# Patient Record
Sex: Female | Born: 1955 | Race: White | Hispanic: No | Marital: Single | Smoking: Never smoker
Health system: Southern US, Community
[De-identification: ages and names within clinical notes are randomized; demographics above are authoritative.]

## PROBLEM LIST (undated history)

## (undated) DIAGNOSIS — F419 Anxiety disorder, unspecified: Secondary | ICD-10-CM

## (undated) DIAGNOSIS — G709 Myoneural disorder, unspecified: Secondary | ICD-10-CM

## (undated) DIAGNOSIS — E119 Type 2 diabetes mellitus without complications: Secondary | ICD-10-CM

## (undated) DIAGNOSIS — E785 Hyperlipidemia, unspecified: Secondary | ICD-10-CM

## (undated) DIAGNOSIS — I10 Essential (primary) hypertension: Secondary | ICD-10-CM

## (undated) DIAGNOSIS — T7840XA Allergy, unspecified, initial encounter: Secondary | ICD-10-CM

## (undated) DIAGNOSIS — M1990 Unspecified osteoarthritis, unspecified site: Secondary | ICD-10-CM

## (undated) DIAGNOSIS — F32A Depression, unspecified: Secondary | ICD-10-CM

## (undated) HISTORY — DX: Anxiety disorder, unspecified: F41.9

## (undated) HISTORY — DX: Type 2 diabetes mellitus without complications: E11.9

## (undated) HISTORY — PX: SPINE SURGERY: SHX786

## (undated) HISTORY — DX: Unspecified osteoarthritis, unspecified site: M19.90

## (undated) HISTORY — DX: Depression, unspecified: F32.A

## (undated) HISTORY — PX: SHOULDER SURGERY: SHX246

## (undated) HISTORY — PX: CARPAL TUNNEL WITH CUBITAL TUNNEL: SHX5608

## (undated) HISTORY — DX: Hyperlipidemia, unspecified: E78.5

## (undated) HISTORY — DX: Essential (primary) hypertension: I10

## (undated) HISTORY — DX: Myoneural disorder, unspecified: G70.9

## (undated) HISTORY — PX: HAND SURGERY: SHX662

## (undated) HISTORY — DX: Allergy, unspecified, initial encounter: T78.40XA

## (undated) HISTORY — PX: CHOLECYSTECTOMY: SHX55

## (undated) HISTORY — PX: TUBAL LIGATION: SHX77

---

## 2000-10-02 ENCOUNTER — Other Ambulatory Visit: Admission: RE | Admit: 2000-10-02 | Discharge: 2000-10-02 | Payer: Self-pay | Admitting: Family Medicine

## 2001-05-26 ENCOUNTER — Encounter: Payer: Self-pay | Admitting: Family Medicine

## 2001-05-26 ENCOUNTER — Encounter: Admission: RE | Admit: 2001-05-26 | Discharge: 2001-05-26 | Payer: Self-pay | Admitting: Family Medicine

## 2001-12-01 ENCOUNTER — Encounter: Admission: RE | Admit: 2001-12-01 | Discharge: 2001-12-01 | Payer: Self-pay | Admitting: Neurosurgery

## 2001-12-01 ENCOUNTER — Encounter: Payer: Self-pay | Admitting: Neurosurgery

## 2001-12-15 ENCOUNTER — Encounter: Admission: RE | Admit: 2001-12-15 | Discharge: 2001-12-15 | Payer: Self-pay | Admitting: Neurosurgery

## 2001-12-15 ENCOUNTER — Encounter: Payer: Self-pay | Admitting: Neurosurgery

## 2002-04-27 ENCOUNTER — Encounter: Payer: Self-pay | Admitting: Neurosurgery

## 2002-04-27 ENCOUNTER — Ambulatory Visit (HOSPITAL_COMMUNITY): Admission: RE | Admit: 2002-04-27 | Discharge: 2002-04-27 | Payer: Self-pay | Admitting: Neurosurgery

## 2003-01-16 ENCOUNTER — Encounter: Payer: Self-pay | Admitting: Rheumatology

## 2003-01-16 ENCOUNTER — Encounter: Admission: RE | Admit: 2003-01-16 | Discharge: 2003-01-16 | Payer: Self-pay | Admitting: Rheumatology

## 2003-01-25 ENCOUNTER — Encounter: Admission: RE | Admit: 2003-01-25 | Discharge: 2003-04-25 | Payer: Self-pay | Admitting: Family Medicine

## 2003-05-06 ENCOUNTER — Encounter: Admission: RE | Admit: 2003-05-06 | Discharge: 2003-08-04 | Payer: Self-pay | Admitting: Family Medicine

## 2003-09-28 ENCOUNTER — Encounter: Admission: RE | Admit: 2003-09-28 | Discharge: 2003-12-27 | Payer: Self-pay | Admitting: Family Medicine

## 2004-02-01 ENCOUNTER — Encounter: Admission: RE | Admit: 2004-02-01 | Discharge: 2004-02-01 | Payer: Self-pay | Admitting: Family Medicine

## 2004-05-29 ENCOUNTER — Ambulatory Visit: Payer: Self-pay | Admitting: Family Medicine

## 2004-06-20 ENCOUNTER — Other Ambulatory Visit: Admission: RE | Admit: 2004-06-20 | Discharge: 2004-06-20 | Payer: Self-pay | Admitting: Family Medicine

## 2005-05-30 ENCOUNTER — Ambulatory Visit: Payer: Self-pay | Admitting: Family Medicine

## 2005-07-03 ENCOUNTER — Other Ambulatory Visit: Admission: RE | Admit: 2005-07-03 | Discharge: 2005-07-03 | Payer: Self-pay | Admitting: Family Medicine

## 2005-08-21 ENCOUNTER — Ambulatory Visit (HOSPITAL_COMMUNITY): Admission: RE | Admit: 2005-08-21 | Discharge: 2005-08-21 | Payer: Self-pay | Admitting: Gastroenterology

## 2005-08-21 ENCOUNTER — Encounter (INDEPENDENT_AMBULATORY_CARE_PROVIDER_SITE_OTHER): Payer: Self-pay | Admitting: *Deleted

## 2006-09-25 ENCOUNTER — Ambulatory Visit: Payer: Self-pay | Admitting: Family Medicine

## 2006-09-30 ENCOUNTER — Other Ambulatory Visit: Admission: RE | Admit: 2006-09-30 | Discharge: 2006-09-30 | Payer: Self-pay | Admitting: Family Medicine

## 2007-06-24 ENCOUNTER — Encounter: Admission: RE | Admit: 2007-06-24 | Discharge: 2007-06-24 | Payer: Self-pay | Admitting: Family Medicine

## 2008-01-12 ENCOUNTER — Ambulatory Visit: Payer: Self-pay | Admitting: Family Medicine

## 2008-09-28 ENCOUNTER — Other Ambulatory Visit: Admission: RE | Admit: 2008-09-28 | Discharge: 2008-09-28 | Payer: Self-pay | Admitting: Family Medicine

## 2008-10-13 ENCOUNTER — Encounter: Admission: RE | Admit: 2008-10-13 | Discharge: 2008-10-13 | Payer: Self-pay | Admitting: General Surgery

## 2009-05-09 ENCOUNTER — Ambulatory Visit (HOSPITAL_COMMUNITY): Admission: RE | Admit: 2009-05-09 | Discharge: 2009-05-09 | Payer: Self-pay | Admitting: General Surgery

## 2009-06-19 ENCOUNTER — Ambulatory Visit: Payer: Self-pay | Admitting: Family Medicine

## 2010-07-08 LAB — BASIC METABOLIC PANEL
CO2: 25 mEq/L (ref 19–32)
Chloride: 105 mEq/L (ref 96–112)
Creatinine, Ser: 0.65 mg/dL (ref 0.4–1.2)
GFR calc Af Amer: >60 mL/min (ref >60)
Potassium: 4 mEq/L (ref 3.5–5.1)

## 2010-07-08 LAB — CBC
HCT: 40.9 % (ref 36.0–46.0)
MCHC: 34.9 g/dL (ref 30.0–36.0)
MCV: 91.8 fL (ref 78.0–100.0)
RBC: 4.45 MIL/uL (ref 3.87–5.11)

## 2010-07-08 LAB — GLUCOSE, CAPILLARY

## 2010-07-08 LAB — URINALYSIS, ROUTINE W REFLEX MICROSCOPIC
Glucose, UA: NEGATIVE mg/dL
Ketones, ur: NEGATIVE mg/dL
Protein, ur: NEGATIVE mg/dL
Urobilinogen, UA: 0.2 mg/dL (ref 0.0–1.0)

## 2010-07-08 LAB — DIFFERENTIAL
Basophils Relative: 1 % (ref 0–1)
Eosinophils Absolute: 1.3 10*3/uL — ABNORMAL HIGH (ref 0.0–0.7)
Eosinophils Relative: 17 % — ABNORMAL HIGH (ref 0–5)
Monocytes Absolute: 0.4 10*3/uL (ref 0.1–1.0)
Monocytes Relative: 5 % (ref 3–12)
Neutrophils Relative %: 43 % (ref 43–77)

## 2010-08-16 ENCOUNTER — Ambulatory Visit: Payer: Self-pay | Admitting: Family Medicine

## 2010-09-07 NOTE — Op Note (Signed)
NAMESUMAYA, Kathleen Hickman             ACCOUNT NO.:  [id number]   MEDICAL RECORD NO.:  [id number]          PATIENT TYPE:  AMB   LOCATION:  ENDO                         FACILITY:  [facility]   PHYSICIAN:  Bernette Redbird, M.D.   DATE OF BIRTH:  1955-06-14   DATE OF PROCEDURE:  08/21/2005  DATE OF DISCHARGE:                                 OPERATIVE REPORT   PROCEDURE:  Colonoscopy with biopsies.   ENDOSCOPIST:  Bernette Redbird, M.D.   INDICATIONS:  A 55 year old female for initial colon cancer screening  examination.  In addition, the patient has a fairly longstanding history of  diarrhea, characterized by 4-5 loose bowl movements a day made at by a 45  loose bowel movements a day, which is currently thought to possibly be due  to her metformin.   FINDINGS:  Two diminutive polyps, otherwise normal colonoscopy.   DESCRIPTION OF PROCEDURE:  The nature, purpose and risks of the procedure  had been discussed with the patient as an outpatient through our open access  program, and I reviewed the purpose and risks with her at the bedside prior  to the initiation of the exam.  She had provided written consent.  Sedation  was Phenergan 25 mg, fentanyl 75 mcg and Versed 8 mg IV without arrhythmias  or desaturation.  The Olympus adult adjustable video colonoscope was  advanced without significant difficulty to the terminal ileum, which had  normal appearance and pullback was then performed.  The quality of prep was  very good, and it is felt that all areas were well seen.   In the cecum was a diminutive 2 mm sessile polyp removed by a single cold  biopsy and there was another diminutive 2 mm sessile polyp at 12 cm from the  anal verge in the mid rectum removed by another single cold biopsy.   In addition, random mucosal biopsies were obtained along the length of the  colon to help rule out microscopic colitis.  There was no endoscopic  evidence of inflammatory bowel disease or active colitis, nor any  large  polyps, cancer, vascular ectasia or diverticular change.  Retroflexion was  not performed in the rectum due to a small rectal ampulla and the proximity  of the rectal biopsies.   However, reinspection with antegrade viewing was unremarkable in the rectum.   The patient tolerated the procedure well, and there no apparent  complications.   IMPRESSION:  1.  Diminutive colon polyps, removed as described above.  2.  Diarrhea without source endoscopically evident on current examination.   PLAN:  Await pathology results.           ______________________________  Bernette Redbird, M.D.     RB/MEDQ  D:  08/21/2005  T:  08/22/2005  Job:  [id number]   cc:   Gretta Arab. Valentina Lucks, M.D.  Fax: [phone]

## 2011-08-20 ENCOUNTER — Ambulatory Visit: Payer: Self-pay | Admitting: Family Medicine

## 2012-02-27 ENCOUNTER — Other Ambulatory Visit (HOSPITAL_COMMUNITY)
Admission: RE | Admit: 2012-02-27 | Discharge: 2012-02-27 | Disposition: A | Discharge status: Home / Self Care | Payer: Medicare Other | Source: Ambulatory Visit | Attending: Family Medicine | Admitting: Family Medicine

## 2012-02-27 ENCOUNTER — Other Ambulatory Visit: Payer: Self-pay | Admitting: Family Medicine

## 2012-02-27 DIAGNOSIS — Z124 Encounter for screening for malignant neoplasm of cervix: Secondary | ICD-10-CM | POA: Insufficient documentation

## 2012-10-28 ENCOUNTER — Ambulatory Visit: Payer: Self-pay | Admitting: Family Medicine

## 2013-06-08 ENCOUNTER — Other Ambulatory Visit: Payer: Self-pay | Admitting: Family Medicine

## 2013-06-08 DIAGNOSIS — J329 Chronic sinusitis, unspecified: Secondary | ICD-10-CM

## 2013-06-10 ENCOUNTER — Other Ambulatory Visit: Payer: Medicare Other

## 2013-06-16 ENCOUNTER — Other Ambulatory Visit: Payer: Medicare Other

## 2013-06-25 ENCOUNTER — Ambulatory Visit: Payer: Medicare Other

## 2013-07-02 ENCOUNTER — Ambulatory Visit: Payer: Medicare Other

## 2013-07-06 ENCOUNTER — Ambulatory Visit: Payer: Medicare Other

## 2013-07-09 ENCOUNTER — Ambulatory Visit: Payer: Medicare Other

## 2013-07-13 ENCOUNTER — Ambulatory Visit: Payer: Medicare Other

## 2013-07-20 ENCOUNTER — Ambulatory Visit: Payer: Medicare Other

## 2013-08-20 ENCOUNTER — Other Ambulatory Visit: Payer: Self-pay | Admitting: Family Medicine

## 2013-08-20 ENCOUNTER — Encounter (INDEPENDENT_AMBULATORY_CARE_PROVIDER_SITE_OTHER): Payer: Self-pay

## 2013-08-20 ENCOUNTER — Ambulatory Visit
Admission: RE | Admit: 2013-08-20 | Discharge: 2013-08-20 | Disposition: A | Discharge status: Home / Self Care | Payer: Commercial Managed Care - HMO | Source: Ambulatory Visit | Attending: Family Medicine | Admitting: Family Medicine

## 2013-08-20 DIAGNOSIS — R05 Cough: Secondary | ICD-10-CM

## 2013-08-20 DIAGNOSIS — R059 Cough, unspecified: Secondary | ICD-10-CM

## 2013-12-31 ENCOUNTER — Ambulatory Visit (INDEPENDENT_AMBULATORY_CARE_PROVIDER_SITE_OTHER): Payer: Commercial Managed Care - HMO

## 2013-12-31 ENCOUNTER — Encounter: Payer: Self-pay | Admitting: Podiatrist

## 2013-12-31 ENCOUNTER — Ambulatory Visit (INDEPENDENT_AMBULATORY_CARE_PROVIDER_SITE_OTHER): Payer: Commercial Managed Care - HMO | Admitting: Podiatrist

## 2013-12-31 VITALS — BP 183/99 | HR 75 | Resp 15 | Ht 59.0 in | Wt 176.0 lb

## 2013-12-31 DIAGNOSIS — M6740 Ganglion, unspecified site: Secondary | ICD-10-CM

## 2013-12-31 NOTE — Patient Instructions (Signed)

## 2013-12-31 NOTE — Progress Notes (Signed)
   Subjective:    Patient ID: Kathleen Hickman, female    DOB: 06-12-1955, 58 y.o.   MRN: [id number]  HPI Comments: Pt feels she began to have problem with a cyst on her dorsal right midfoot after a episode of walking for exercise instead of water aerobics.  This began 3 weeks ago and Dr. Kelton Pillar referred to Dr. Valentina Lucks.     Review of Systems  All other systems reviewed and are negative.      Objective:   Physical Exam GENERAL APPEARANCE: Alert, conversant. Appropriately groomed. No acute distress.  VASCULAR: Pedal pulses palpable at 2/4 DP and PT bilateral.  Capillary refill time is immediate to all digits,  Proximal to distal cooling it warm to warm.  Digital hair growth is present bilateral  NEUROLOGIC: sensation is intact epicritically and protectively to 5.07 monofilament at 5/5 sites bilateral.  Light touch is intact bilateral, vibratory sensation intact bilateral, achilles tendon reflex is intact bilateral.  MUSCULOSKELETAL: acceptable muscle strength, tone and stability bilateral. Hallux abductovalgus deformity noted right greater than left. Contracture of second toe is also present right greater than left. DERMATOLOGIC: Very small difficult to palpate cyst is present on the dorsal central aspect of the right foot. The patient states that the cyst was larger however it has now subsided. A Hyperkeratotic lesion present medial first metatarsal head right and right hallux. Otherwise skin color, texture, and turger are within normal limits. no interdigital maceration noted.  No open lesions present.  Digital nails are asymptomatic.     Assessment & Plan:  Cyst dorsal aspect right foot which is improved at its own, diabetic foot examination  Plan: Discussed that because of the area of the cyst is likely a ganglionic type of cyst which can be irritated from friction and tight shoes. We had a discussion about her finding a good pair walking or running shoes and she was recommended to  go to [employer]. If the cyst comes back she will let me know and we will drain or inject it at that time however now it is too small for that treatment. Recommended yearly or 18 month diabetic foot evaluation.

## 2014-01-11 ENCOUNTER — Ambulatory Visit: Payer: Self-pay | Admitting: Family Medicine

## 2015-04-03 ENCOUNTER — Ambulatory Visit
Admission: RE | Admit: 2015-04-03 | Discharge: 2015-04-03 | Disposition: A | Discharge status: Home / Self Care | Payer: Commercial Managed Care - HMO | Source: Ambulatory Visit | Attending: Family Medicine | Admitting: Family Medicine

## 2015-04-03 ENCOUNTER — Other Ambulatory Visit: Payer: Self-pay | Admitting: Family Medicine

## 2015-04-03 DIAGNOSIS — S6992XA Unspecified injury of left wrist, hand and finger(s), initial encounter: Secondary | ICD-10-CM

## 2015-06-12 DIAGNOSIS — M2012 Hallux valgus (acquired), left foot: Secondary | ICD-10-CM | POA: Diagnosis not present

## 2015-06-12 DIAGNOSIS — M2011 Hallux valgus (acquired), right foot: Secondary | ICD-10-CM | POA: Diagnosis not present

## 2015-06-12 DIAGNOSIS — G5790 Unspecified mononeuropathy of unspecified lower limb: Secondary | ICD-10-CM | POA: Diagnosis not present

## 2015-07-31 DIAGNOSIS — H40003 Preglaucoma, unspecified, bilateral: Secondary | ICD-10-CM | POA: Diagnosis not present

## 2015-08-14 ENCOUNTER — Other Ambulatory Visit: Payer: Self-pay | Admitting: Family Medicine

## 2015-08-14 DIAGNOSIS — Z1231 Encounter for screening mammogram for malignant neoplasm of breast: Secondary | ICD-10-CM

## 2015-08-22 ENCOUNTER — Ambulatory Visit
Admission: RE | Admit: 2015-08-22 | Discharge: 2015-08-22 | Disposition: A | Discharge status: Home / Self Care | Payer: PPO | Source: Ambulatory Visit | Attending: Family Medicine | Admitting: Family Medicine

## 2015-08-22 DIAGNOSIS — Z1231 Encounter for screening mammogram for malignant neoplasm of breast: Secondary | ICD-10-CM | POA: Diagnosis not present

## 2015-11-07 ENCOUNTER — Other Ambulatory Visit (HOSPITAL_COMMUNITY)
Admission: RE | Admit: 2015-11-07 | Discharge: 2015-11-07 | Disposition: A | Discharge status: Home / Self Care | Payer: PPO | Source: Ambulatory Visit | Attending: Family Medicine | Admitting: Family Medicine

## 2015-11-07 ENCOUNTER — Other Ambulatory Visit: Payer: Self-pay | Admitting: Family Medicine

## 2015-11-07 DIAGNOSIS — Z124 Encounter for screening for malignant neoplasm of cervix: Secondary | ICD-10-CM | POA: Insufficient documentation

## 2015-11-07 DIAGNOSIS — F39 Unspecified mood [affective] disorder: Secondary | ICD-10-CM | POA: Diagnosis not present

## 2015-11-07 DIAGNOSIS — I129 Hypertensive chronic kidney disease with stage 1 through stage 4 chronic kidney disease, or unspecified chronic kidney disease: Secondary | ICD-10-CM | POA: Diagnosis not present

## 2015-11-07 DIAGNOSIS — K219 Gastro-esophageal reflux disease without esophagitis: Secondary | ICD-10-CM | POA: Diagnosis not present

## 2015-11-07 DIAGNOSIS — E7800 Pure hypercholesterolemia, unspecified: Secondary | ICD-10-CM | POA: Diagnosis not present

## 2015-11-07 DIAGNOSIS — M797 Fibromyalgia: Secondary | ICD-10-CM | POA: Diagnosis not present

## 2015-11-07 DIAGNOSIS — E1121 Type 2 diabetes mellitus with diabetic nephropathy: Secondary | ICD-10-CM | POA: Diagnosis not present

## 2015-11-07 DIAGNOSIS — E1142 Type 2 diabetes mellitus with diabetic polyneuropathy: Secondary | ICD-10-CM | POA: Diagnosis not present

## 2015-11-07 DIAGNOSIS — N181 Chronic kidney disease, stage 1: Secondary | ICD-10-CM | POA: Diagnosis not present

## 2015-11-07 DIAGNOSIS — Z01419 Encounter for gynecological examination (general) (routine) without abnormal findings: Secondary | ICD-10-CM | POA: Diagnosis not present

## 2015-11-07 DIAGNOSIS — J309 Allergic rhinitis, unspecified: Secondary | ICD-10-CM | POA: Diagnosis not present

## 2015-11-07 DIAGNOSIS — Z0000 Encounter for general adult medical examination without abnormal findings: Secondary | ICD-10-CM | POA: Diagnosis not present

## 2015-11-09 LAB — CYTOLOGY - PAP

## 2016-01-29 DIAGNOSIS — E119 Type 2 diabetes mellitus without complications: Secondary | ICD-10-CM | POA: Diagnosis not present

## 2016-03-16 ENCOUNTER — Encounter: Payer: Self-pay | Admitting: Emergency Medicine

## 2016-03-16 ENCOUNTER — Emergency Department
Admission: EM | Admit: 2016-03-16 | Discharge: 2016-03-16 | Disposition: A | Discharge status: Home / Self Care | Payer: PPO | Attending: Emergency Medicine | Admitting: Emergency Medicine

## 2016-03-16 DIAGNOSIS — Y92009 Unspecified place in unspecified non-institutional (private) residence as the place of occurrence of the external cause: Secondary | ICD-10-CM | POA: Insufficient documentation

## 2016-03-16 DIAGNOSIS — Y9389 Activity, other specified: Secondary | ICD-10-CM | POA: Insufficient documentation

## 2016-03-16 DIAGNOSIS — W268XXA Contact with other sharp object(s), not elsewhere classified, initial encounter: Secondary | ICD-10-CM | POA: Diagnosis not present

## 2016-03-16 DIAGNOSIS — Y999 Unspecified external cause status: Secondary | ICD-10-CM | POA: Insufficient documentation

## 2016-03-16 DIAGNOSIS — I10 Essential (primary) hypertension: Secondary | ICD-10-CM | POA: Diagnosis not present

## 2016-03-16 DIAGNOSIS — E785 Hyperlipidemia, unspecified: Secondary | ICD-10-CM | POA: Insufficient documentation

## 2016-03-16 DIAGNOSIS — E119 Type 2 diabetes mellitus without complications: Secondary | ICD-10-CM | POA: Diagnosis not present

## 2016-03-16 DIAGNOSIS — Z7982 Long term (current) use of aspirin: Secondary | ICD-10-CM | POA: Diagnosis not present

## 2016-03-16 DIAGNOSIS — Z79899 Other long term (current) drug therapy: Secondary | ICD-10-CM | POA: Diagnosis not present

## 2016-03-16 DIAGNOSIS — Z7984 Long term (current) use of oral hypoglycemic drugs: Secondary | ICD-10-CM | POA: Insufficient documentation

## 2016-03-16 DIAGNOSIS — S81811A Laceration without foreign body, right lower leg, initial encounter: Secondary | ICD-10-CM | POA: Insufficient documentation

## 2016-03-16 NOTE — ED Triage Notes (Signed)
Was shaving legs approx 1330 today and cut back of R leg, states continues to bleed when pressure not on it. Takes aspirin but no other blood thinners.

## 2016-03-16 NOTE — ED Provider Notes (Signed)
[facility] Emergency Department Provider Note   ____________________________________________   None    (approximate)  I have reviewed the triage vital signs and the nursing notes.   HISTORY  Chief Complaint Laceration    HPI Kathleen Hickman is a 60 y.o. female patient presents with a laceration to the back of the right calf secondary to shaving incident home. Patient state leg continues to bleed when pressure is not applied. Patient states she does not take prescribed blood thinners but take 81 mg aspirin daily. Patient denies loss sensation or loss of function of the right lower extremity. No other palliative measures for this complaint.Patient denies pain with this complaint.   Past Medical History:  Diagnosis Date  . Diabetes mellitus without complication ([place])   . Hyperlipidemia   . Hypertension     There are no active problems to display for this patient.   Past Surgical History:  Procedure Laterality Date  . CARPAL TUNNEL WITH CUBITAL TUNNEL Right   . CHOLECYSTECTOMY    . HAND SURGERY Right    fracture repair in 1990  . SHOULDER SURGERY Right   . SPINE SURGERY    . TUBAL LIGATION      Prior to Admission medications   Medication Sig Start Date End Date Taking? Authorizing Provider  aspirin EC 81 MG tablet Take 81 mg by mouth daily.    Historical Provider, MD  benazepril (LOTENSIN) 10 MG tablet Take 10 mg by mouth daily.    Historical Provider, MD  gabapentin (NEURONTIN) 300 MG capsule Take 300 mg by mouth 3 (three) times daily.    Historical Provider, MD  latanoprost (XALATAN) 0.005 % ophthalmic solution 1 drop at bedtime.    Historical Provider, MD  metFORMIN (GLUCOPHAGE) 500 MG tablet Take by mouth 2 (two) times daily with a meal.    Historical Provider, MD  metoprolol succinate (TOPROL-XL) 25 MG 24 hr tablet Take 25 mg by mouth daily.    Historical Provider, MD  Multiple Vitamins-Minerals (MULTI FOR HER PO) Take by mouth.     Historical Provider, MD  nabumetone (RELAFEN) 750 MG tablet Take 750 mg by mouth daily.    Historical Provider, MD  sertraline (ZOLOFT) 50 MG tablet Take 50 mg by mouth daily.    Historical Provider, MD  traMADol (ULTRAM) 50 MG tablet Take by mouth every 6 (six) hours as needed.    Historical Provider, MD  zolpidem (AMBIEN) 10 MG tablet Take 10 mg by mouth at bedtime as needed for sleep.    Historical Provider, MD    Allergies Morphine and related; Aspirin; Ciprofloxacin; Codeine; Influenza vaccines; and Sulfur  Family History  Problem Relation Age of Onset  . Breast cancer Neg Hx     Social History Social History  Substance Use Topics  . Smoking status: Never Smoker  . Smokeless tobacco: Not on file  . Alcohol use Not on file    Review of Systems Constitutional: No fever/chills Eyes: No visual changes. ENT: No sore throat. Cardiovascular: Denies chest pain. Respiratory: Denies shortness of breath. Gastrointestinal: No abdominal pain.  No nausea, no vomiting.  No diarrhea.  No constipation. Genitourinary: Negative for dysuria. Musculoskeletal: Negative for back pain. Skin: Negative for rash. Neurological: Negative for headaches, focal weakness or numbness. Endocrine:Hypertension, hyperlipidemia, and diabetes.   ____________________________________________   PHYSICAL EXAM:  VITAL SIGNS: ED Triage Vitals  Enc Vitals Group     BP 03/16/16 1543 (!) 171/83     Pulse Rate 03/16/16  1543 98     Resp 03/16/16 1543 20     Temp 03/16/16 1543 98.5 F (36.9 C)     Temp Source 03/16/16 1543 Oral     SpO2 03/16/16 1543 94 %     Weight 03/16/16 1544 170 lb (77.1 kg)     Height 03/16/16 1544 4\' 11"  (1.499 m)     Head Circumference --      Peak Flow --      Pain Score --      Pain Loc --      Pain Edu? --      Excl. in [place]? --     Constitutional: Alert and oriented. Well appearing and in no acute distress. Eyes: Conjunctivae are normal. PERRL. EOMI. Head:  Atraumatic. Nose: No congestion/rhinnorhea. Mouth/Throat: Mucous membranes are moist.  Oropharynx non-erythematous. Neck: No stridor.  No cervical spine tenderness to palpation. Hematological/Lymphatic/Immunilogical: No cervical lymphadenopathy. Cardiovascular: Normal rate, regular rhythm. Grossly normal heart sounds.  Good peripheral circulation. Respiratory: Normal respiratory effort.  No retractions. Lungs CTAB. Gastrointestinal: Soft and nontender. No distention. No abdominal bruits. No CVA tenderness. Musculoskeletal: No lower extremity tenderness nor edema.  No joint effusions. Neurologic:  Normal speech and language. No gross focal neurologic deficits are appreciated. No gait instability. Skin:  Skin is warm, dry and intact. No rash noted. Three millimeter laceration of right calf with mild hemorrhaging. He stated control with direct pressure. Psychiatric: Mood and affect are normal. Speech and behavior are normal.  ____________________________________________   LABS (all labs ordered are listed, but only abnormal results are displayed)  Labs Reviewed - No data to display ____________________________________________  EKG   ____________________________________________  RADIOLOGY   ____________________________________________   PROCEDURES  Procedure(s) performed: LACERATION REPAIR Performed by: Sable Feil Authorized by: Sable Feil Consent: Verbal consent obtained. Risks and benefits: risks, benefits and alternatives were discussed Consent given by: patient Patient identity confirmed: provided demographic data Prepped and Draped in normal sterile fashion Wound explored Laceration Location: Right lower leg Laceration Length: 41mm No Foreign Bodies seen or palpated Irrigation method: syringe Amount of cleaning: standard Skin closure: Dermabond Patient tolerance: Patient tolerated the procedure well with no immediate complications.   Procedures  Critical  Care performed: No  ____________________________________________   INITIAL IMPRESSION / ASSESSMENT AND PLAN / ED COURSE  Pertinent labs & imaging results that were available during my care of the patient were reviewed by me and considered in my medical decision making (see chart for details).  Laceration right lower leg. Patient given discharge care instructions. Patient advised return back if wound reopens and bleed.  Clinical Course      ____________________________________________   FINAL CLINICAL IMPRESSION(S) / ED DIAGNOSES  Final diagnoses:  Laceration of right lower extremity, initial encounter      NEW MEDICATIONS STARTED DURING THIS VISIT:  New Prescriptions   No medications on file     Note:  This document was prepared using Dragon voice recognition software and may include unintentional dictation errors.    Sable Feil, PA-C 03/16/16 1705    Lavonia Drafts, MD 03/16/16 [phone]

## 2016-03-20 DIAGNOSIS — S81811A Laceration without foreign body, right lower leg, initial encounter: Secondary | ICD-10-CM | POA: Diagnosis not present

## 2016-05-13 DIAGNOSIS — E1121 Type 2 diabetes mellitus with diabetic nephropathy: Secondary | ICD-10-CM | POA: Diagnosis not present

## 2016-05-13 DIAGNOSIS — I129 Hypertensive chronic kidney disease with stage 1 through stage 4 chronic kidney disease, or unspecified chronic kidney disease: Secondary | ICD-10-CM | POA: Diagnosis not present

## 2016-05-13 DIAGNOSIS — Z7984 Long term (current) use of oral hypoglycemic drugs: Secondary | ICD-10-CM | POA: Diagnosis not present

## 2016-05-13 DIAGNOSIS — N181 Chronic kidney disease, stage 1: Secondary | ICD-10-CM | POA: Diagnosis not present

## 2016-07-30 DIAGNOSIS — H40003 Preglaucoma, unspecified, bilateral: Secondary | ICD-10-CM | POA: Diagnosis not present

## 2016-08-06 DIAGNOSIS — H40003 Preglaucoma, unspecified, bilateral: Secondary | ICD-10-CM | POA: Diagnosis not present

## 2016-08-15 DIAGNOSIS — E1142 Type 2 diabetes mellitus with diabetic polyneuropathy: Secondary | ICD-10-CM | POA: Diagnosis not present

## 2016-08-15 DIAGNOSIS — Z7984 Long term (current) use of oral hypoglycemic drugs: Secondary | ICD-10-CM | POA: Diagnosis not present

## 2016-09-23 ENCOUNTER — Other Ambulatory Visit: Payer: Self-pay | Admitting: Family Medicine

## 2016-09-23 DIAGNOSIS — Z1231 Encounter for screening mammogram for malignant neoplasm of breast: Secondary | ICD-10-CM

## 2016-10-08 ENCOUNTER — Ambulatory Visit
Admission: RE | Admit: 2016-10-08 | Discharge: 2016-10-08 | Disposition: A | Discharge status: Home / Self Care | Payer: PPO | Source: Ambulatory Visit | Attending: Family Medicine | Admitting: Family Medicine

## 2016-10-08 DIAGNOSIS — Z1231 Encounter for screening mammogram for malignant neoplasm of breast: Secondary | ICD-10-CM | POA: Insufficient documentation

## 2016-12-03 DIAGNOSIS — I129 Hypertensive chronic kidney disease with stage 1 through stage 4 chronic kidney disease, or unspecified chronic kidney disease: Secondary | ICD-10-CM | POA: Diagnosis not present

## 2016-12-03 DIAGNOSIS — J309 Allergic rhinitis, unspecified: Secondary | ICD-10-CM | POA: Diagnosis not present

## 2016-12-03 DIAGNOSIS — K219 Gastro-esophageal reflux disease without esophagitis: Secondary | ICD-10-CM | POA: Diagnosis not present

## 2016-12-03 DIAGNOSIS — N181 Chronic kidney disease, stage 1: Secondary | ICD-10-CM | POA: Diagnosis not present

## 2016-12-03 DIAGNOSIS — M797 Fibromyalgia: Secondary | ICD-10-CM | POA: Diagnosis not present

## 2016-12-03 DIAGNOSIS — M545 Low back pain: Secondary | ICD-10-CM | POA: Diagnosis not present

## 2016-12-03 DIAGNOSIS — E7800 Pure hypercholesterolemia, unspecified: Secondary | ICD-10-CM | POA: Diagnosis not present

## 2016-12-03 DIAGNOSIS — Z7984 Long term (current) use of oral hypoglycemic drugs: Secondary | ICD-10-CM | POA: Diagnosis not present

## 2016-12-03 DIAGNOSIS — F39 Unspecified mood [affective] disorder: Secondary | ICD-10-CM | POA: Diagnosis not present

## 2016-12-03 DIAGNOSIS — E1142 Type 2 diabetes mellitus with diabetic polyneuropathy: Secondary | ICD-10-CM | POA: Diagnosis not present

## 2016-12-03 DIAGNOSIS — Z0000 Encounter for general adult medical examination without abnormal findings: Secondary | ICD-10-CM | POA: Diagnosis not present

## 2016-12-03 DIAGNOSIS — E1121 Type 2 diabetes mellitus with diabetic nephropathy: Secondary | ICD-10-CM | POA: Diagnosis not present

## 2016-12-31 DIAGNOSIS — D2272 Melanocytic nevi of left lower limb, including hip: Secondary | ICD-10-CM | POA: Diagnosis not present

## 2016-12-31 DIAGNOSIS — D225 Melanocytic nevi of trunk: Secondary | ICD-10-CM | POA: Diagnosis not present

## 2016-12-31 DIAGNOSIS — L820 Inflamed seborrheic keratosis: Secondary | ICD-10-CM | POA: Diagnosis not present

## 2016-12-31 DIAGNOSIS — D485 Neoplasm of uncertain behavior of skin: Secondary | ICD-10-CM | POA: Diagnosis not present

## 2017-01-02 ENCOUNTER — Ambulatory Visit: Payer: Commercial Managed Care - HMO | Admitting: Dietician

## 2017-01-07 ENCOUNTER — Encounter: Payer: PPO | Attending: Family Medicine | Admitting: Dietician

## 2017-01-07 ENCOUNTER — Encounter: Payer: Self-pay | Admitting: Dietician

## 2017-01-07 VITALS — Ht 59.0 in | Wt 178.9 lb

## 2017-01-07 DIAGNOSIS — K219 Gastro-esophageal reflux disease without esophagitis: Secondary | ICD-10-CM | POA: Diagnosis not present

## 2017-01-07 DIAGNOSIS — Z713 Dietary counseling and surveillance: Secondary | ICD-10-CM | POA: Diagnosis not present

## 2017-01-07 DIAGNOSIS — E1122 Type 2 diabetes mellitus with diabetic chronic kidney disease: Secondary | ICD-10-CM | POA: Diagnosis not present

## 2017-01-07 DIAGNOSIS — E7800 Pure hypercholesterolemia, unspecified: Secondary | ICD-10-CM | POA: Insufficient documentation

## 2017-01-07 DIAGNOSIS — M797 Fibromyalgia: Secondary | ICD-10-CM | POA: Diagnosis not present

## 2017-01-07 DIAGNOSIS — E1142 Type 2 diabetes mellitus with diabetic polyneuropathy: Secondary | ICD-10-CM | POA: Diagnosis not present

## 2017-01-07 DIAGNOSIS — F39 Unspecified mood [affective] disorder: Secondary | ICD-10-CM | POA: Insufficient documentation

## 2017-01-07 DIAGNOSIS — M545 Low back pain: Secondary | ICD-10-CM | POA: Insufficient documentation

## 2017-01-07 DIAGNOSIS — Z6836 Body mass index (BMI) 36.0-36.9, adult: Secondary | ICD-10-CM | POA: Diagnosis not present

## 2017-01-07 DIAGNOSIS — N181 Chronic kidney disease, stage 1: Secondary | ICD-10-CM | POA: Diagnosis not present

## 2017-01-07 DIAGNOSIS — Z79899 Other long term (current) drug therapy: Secondary | ICD-10-CM | POA: Diagnosis not present

## 2017-01-07 DIAGNOSIS — Z7982 Long term (current) use of aspirin: Secondary | ICD-10-CM | POA: Diagnosis not present

## 2017-01-07 DIAGNOSIS — Z7984 Long term (current) use of oral hypoglycemic drugs: Secondary | ICD-10-CM | POA: Diagnosis not present

## 2017-01-07 DIAGNOSIS — E1121 Type 2 diabetes mellitus with diabetic nephropathy: Secondary | ICD-10-CM | POA: Diagnosis not present

## 2017-01-07 DIAGNOSIS — E119 Type 2 diabetes mellitus without complications: Secondary | ICD-10-CM | POA: Diagnosis present

## 2017-01-07 DIAGNOSIS — E1169 Type 2 diabetes mellitus with other specified complication: Secondary | ICD-10-CM

## 2017-01-07 DIAGNOSIS — I129 Hypertensive chronic kidney disease with stage 1 through stage 4 chronic kidney disease, or unspecified chronic kidney disease: Secondary | ICD-10-CM | POA: Insufficient documentation

## 2017-01-07 NOTE — Progress Notes (Signed)
Medical Nutrition Therapy: Visit start time: 8:55am  end time: 10:05am Assessment:  Diagnosis: Type 2 Diabetes Past medical history: hypertension, hyperlipidemia, fibromyalgia, elevated liver enzymes, elevated protein in urine Psychosocial issues/ stress concerns:Patient rates her stress as "moderate" and indicates "ok" as to how well she is dealing with her stress.  Preferred learning method:  . Auditory . Visual . Hands-on  Current weight: 178.9 lbs (with shoes) Height: 59 in  Medications, supplements: see list; Saw in MD's notes for patient to start Januvia but patient stated she was not told regarding this and wants to wait to see if glucose improves with her diet and exercise changes.  Progress and evaluation:  Patient came for initial medical nutrition therapy appointment. Her most recent HgA1c was 7.4 (12/08/16). She was also told that her liver enzymes were elevated. She states that she tries to limit carbohydrates but doesn't really understand which foods are carbohydrate and she wants help reading food labels. She doesn't eat breakfast but eats a snack 3 hours after getting up in the morning and also eats lunch and dinner. She rarely eats fried foods and limits added fats such as butter or mayonnaise. She limits sweets and most meals are prepared at home. In general, she is preparing healthy foods but needs help with identifying carbohydrate foods, portions and label reading.   Physical activity: Has joined [employer] but is limited presently due to minor surgery on her foot.  Dietary Intake:  Usual eating pattern includes 2 meals and 1-2 snacks per day. Dining out frequency: 104meal per week.  Breakfast: gets up at 5:30 and drinks several cups of black coffee. Snack: 8:00am- vanilla wafers, peanut butter, banana Lunch: 12:00 leftovers from dinner meal Snack: yogurt or vegetables with hummus Supper: baked chicken, potatoes, green beans or homemade pizza on whole wheat crust  or baked pork chop with baked zucchini, water  Snack: same as afternoon or occasional chips with hot sauce Beverages:water, coffee, green tea with small amount of honey  Nutrition Care Education:  Diabetes: Instructed on a meal plan for diabetes based on 1400 calories, including carbohydrate counting, portion control and how to better balance carbohydrate, protein and non-starchy vegetables. Also, instructed on food label reading. Discussed how this plan is a guide in making healthy choices and being mindful regarding meals/snacks  but discouraged over restriction.  Gave and reviewed "Quick and Healthy" meals as well as additional menu examples. Discussed importance of exercise in decreasing insulin resistance.    Nutritional Diagnosis:  NB-1.1 Food and nutrition-related knowledge deficit As related to understanding food labels or identifying carbohydrates.  As evidenced by her asking for help in these areas. NB-2.1 Physical inactivity As related to minor surgery on foot.  As evidenced by physical activity history..  Intervention:  Balance meals with lean protein, 2-3 servings of carbohydrate (starch, fruit, milk/yogurt), and non-starchy vegetables. Spread 9-10 servings of carbohydrate over 3 meals and 1-2 snacks. Add non-starchy vegetables to as many meals as possible. Read labels for carbohydrate and saturated fat (limit to less than 10 gms daily) Call insurance to ask about coverage of diabetes supplies. Call MD's office to inform that you are not taking Januvia. Education Materials given:  . Plate Planner . Food label handout . Food lists/ Planning A Balanced Meal . Sample meal pattern/ menus . Goals/ instructions  Learner/ who was taught:  . Patient   Level of understanding: . Partial understanding; needs review/ practice Demonstrated degree of understanding via:   Teach back Learning barriers: .  None Willingness to learn/ readiness for change: . Eager, change in  progress Monitoring and Evaluation:  Dietary intake, exercise, , and body weight      follow up: 02/04/17 at 9:00am

## 2017-01-07 NOTE — Patient Instructions (Addendum)
Balance meals with lean protein, 2-3 servings of carbohydrate (starch, fruit, milk/yogurt), and non-starchy vegetables. Spread 9-10 servings of carbohydrate over 3 meals and 1-2 snacks. Add non-starchy vegetables to as many meals as possible. Read labels for carbohydrate and saturated fat (limit to less than 10 gms daily) Call insurance to ask about coverage of diabetes supplies. Call MD's office to inform that you are not taking Januvia.

## 2017-02-04 ENCOUNTER — Ambulatory Visit: Payer: PPO | Admitting: Dietician

## 2017-02-04 DIAGNOSIS — E119 Type 2 diabetes mellitus without complications: Secondary | ICD-10-CM | POA: Diagnosis not present

## 2017-02-06 ENCOUNTER — Encounter: Payer: Self-pay | Admitting: Dietician

## 2017-02-06 ENCOUNTER — Encounter: Payer: PPO | Attending: Family Medicine | Admitting: Dietician

## 2017-02-06 VITALS — Ht 59.0 in | Wt 178.1 lb

## 2017-02-06 DIAGNOSIS — K219 Gastro-esophageal reflux disease without esophagitis: Secondary | ICD-10-CM | POA: Diagnosis not present

## 2017-02-06 DIAGNOSIS — E7800 Pure hypercholesterolemia, unspecified: Secondary | ICD-10-CM | POA: Insufficient documentation

## 2017-02-06 DIAGNOSIS — F39 Unspecified mood [affective] disorder: Secondary | ICD-10-CM | POA: Insufficient documentation

## 2017-02-06 DIAGNOSIS — Z7984 Long term (current) use of oral hypoglycemic drugs: Secondary | ICD-10-CM | POA: Insufficient documentation

## 2017-02-06 DIAGNOSIS — I129 Hypertensive chronic kidney disease with stage 1 through stage 4 chronic kidney disease, or unspecified chronic kidney disease: Secondary | ICD-10-CM | POA: Diagnosis not present

## 2017-02-06 DIAGNOSIS — N181 Chronic kidney disease, stage 1: Secondary | ICD-10-CM | POA: Diagnosis not present

## 2017-02-06 DIAGNOSIS — M797 Fibromyalgia: Secondary | ICD-10-CM | POA: Insufficient documentation

## 2017-02-06 DIAGNOSIS — Z6836 Body mass index (BMI) 36.0-36.9, adult: Secondary | ICD-10-CM | POA: Diagnosis not present

## 2017-02-06 DIAGNOSIS — E1169 Type 2 diabetes mellitus with other specified complication: Secondary | ICD-10-CM

## 2017-02-06 DIAGNOSIS — M545 Low back pain: Secondary | ICD-10-CM | POA: Insufficient documentation

## 2017-02-06 DIAGNOSIS — E1121 Type 2 diabetes mellitus with diabetic nephropathy: Secondary | ICD-10-CM | POA: Insufficient documentation

## 2017-02-06 DIAGNOSIS — Z713 Dietary counseling and surveillance: Secondary | ICD-10-CM | POA: Insufficient documentation

## 2017-02-06 DIAGNOSIS — E119 Type 2 diabetes mellitus without complications: Secondary | ICD-10-CM | POA: Diagnosis present

## 2017-02-06 DIAGNOSIS — E1122 Type 2 diabetes mellitus with diabetic chronic kidney disease: Secondary | ICD-10-CM | POA: Insufficient documentation

## 2017-02-06 DIAGNOSIS — Z7982 Long term (current) use of aspirin: Secondary | ICD-10-CM | POA: Diagnosis not present

## 2017-02-06 DIAGNOSIS — E1142 Type 2 diabetes mellitus with diabetic polyneuropathy: Secondary | ICD-10-CM | POA: Insufficient documentation

## 2017-02-06 DIAGNOSIS — Z79899 Other long term (current) drug therapy: Secondary | ICD-10-CM | POA: Diagnosis not present

## 2017-02-06 NOTE — Progress Notes (Signed)
Medical Nutrition Therapy Follow-up visit:  Time with patient: 8:20-9:20 am Visit # 2 ASSESSMENT:  Diagnosis:Type 2 Diabetes  Current weight:178.1 lbs    Height:59 in Medications: See list Medical History: hypertension, fibromyalgia, elevated liver enzymes, elevated protein in uring Progress and evaluation: Patient in for medical nutrition follow-up. She reports she is now eating breakfast and has not experienced symptoms of low blood sugars since doing that. Her lunch is usually leftovers from the dinner before which usually consist of meat (1-3 oz) and vegetables.  Her beverages are 2 cups coffee, 1 glass unsweetened green tea and 8-10 (16oz) water bottles daily. She is using [employer] phone app to track calories and most days on app show less than 1100 calories. She eats lunch and dinner and limits snacks. She has increased her fruit and vegetable intake and is reading labels for carbohydrate content. Based on body composition analysis done today, her BMR is 1215 calories and with consistent exercise that she does, calories should be in 1400-1500 calorie range. Her weight today is .8 lb less than weight 1 month ago. She did contact her insurance regarding diabetes supplies and should be receiving those soon.  Physical activity: at least 1 hour daily (exercise bike, stretches, lunges, strength training with bands,etc)   NUTRITION CARE EDUCATION: Diabetes:   Commended patient on the positive diet changes she has made. Reviewed food records she has kept on her phone app. Reviewed protein and  carbohydrate amounts needed to meet nutritional needs. Used food guide plate and her food records to review meal plan instructed on at previous visit. Reviewed results of body composition analysis and discussed how weight loss and exercise (especially strength training) can help to lower body fat % and increase muscle mass).   INTERVENTION:  Include at least 6 ounces of protein daily. 1 egg or  2Tbsp peanut butter or 1/2 cup pintos or black beans or 1 ounce cheese or 1/4 cup nuts=1 oz Meat the size of a deck of cards is 3 ounces.  Try my Fitnesspal app which will calculate protein, carbohydrate, fat  as well as calories. Strive for at least 60 gms of protein daily. Include at least 30 gms of carbohydrate per meal with range of 30-45.  At least 120 gms daily.  Continue to include a vegetable or fruit or both at meals.  Check blood sugars with goal of fasting blood sugars less than 130 and 2 hours after the meal-less than160. EDUCATION MATERIALS GIVEN:  . Print out of body composition results. . Goals/ instructions  LEARNER/ who was taught:  . Patient  LEVEL OF UNDERSTANDING: . Partial understanding; needs review/ practice Demonstrated degree of understanding via teach back.  LEARNING BARRIERS: . None  WILLINGNESS TO LEARN/READINESS FOR CHANGE: . Eager, change in progress  MONITORING AND EVALUATION:  Diet, exercise, weight, body composition analysis Follow-up: 03/11/17 at 9:00am

## 2017-02-06 NOTE — Patient Instructions (Addendum)
Include at least 6 ounces of protein daily. 1 egg or 2Tbsp peanut butter or 1/2 cup pintos or black beans or 1 ounce cheese or 1/4 cup nuts=1 oz Meat the size of a deck of cards is 3 ounces.  Try my Fitnesspal app which will calculate protein, carbohydrate, fat  as well as calories. Strive for at least 60 gms of protein daily. Include at least 30 gms of carbohydrate per meal with range of 30-45.  At least 120 gms daily.  Continue to include a vegetable or fruit or both at meals.  Check blood sugars with goal of fasting blood sugars less than 130 and 2 hours after the meal-less than160.

## 2017-03-11 ENCOUNTER — Encounter: Payer: Self-pay | Admitting: Dietician

## 2017-03-11 ENCOUNTER — Encounter: Payer: PPO | Attending: Family Medicine | Admitting: Dietician

## 2017-03-11 DIAGNOSIS — Z6836 Body mass index (BMI) 36.0-36.9, adult: Secondary | ICD-10-CM | POA: Insufficient documentation

## 2017-03-11 DIAGNOSIS — Z79899 Other long term (current) drug therapy: Secondary | ICD-10-CM | POA: Insufficient documentation

## 2017-03-11 DIAGNOSIS — I129 Hypertensive chronic kidney disease with stage 1 through stage 4 chronic kidney disease, or unspecified chronic kidney disease: Secondary | ICD-10-CM | POA: Insufficient documentation

## 2017-03-11 DIAGNOSIS — E7800 Pure hypercholesterolemia, unspecified: Secondary | ICD-10-CM | POA: Insufficient documentation

## 2017-03-11 DIAGNOSIS — E1142 Type 2 diabetes mellitus with diabetic polyneuropathy: Secondary | ICD-10-CM | POA: Diagnosis not present

## 2017-03-11 DIAGNOSIS — Z7982 Long term (current) use of aspirin: Secondary | ICD-10-CM | POA: Insufficient documentation

## 2017-03-11 DIAGNOSIS — Z713 Dietary counseling and surveillance: Secondary | ICD-10-CM | POA: Insufficient documentation

## 2017-03-11 DIAGNOSIS — N181 Chronic kidney disease, stage 1: Secondary | ICD-10-CM | POA: Insufficient documentation

## 2017-03-11 DIAGNOSIS — M797 Fibromyalgia: Secondary | ICD-10-CM | POA: Diagnosis not present

## 2017-03-11 DIAGNOSIS — F39 Unspecified mood [affective] disorder: Secondary | ICD-10-CM | POA: Insufficient documentation

## 2017-03-11 DIAGNOSIS — E1122 Type 2 diabetes mellitus with diabetic chronic kidney disease: Secondary | ICD-10-CM | POA: Diagnosis not present

## 2017-03-11 DIAGNOSIS — K219 Gastro-esophageal reflux disease without esophagitis: Secondary | ICD-10-CM | POA: Insufficient documentation

## 2017-03-11 DIAGNOSIS — E119 Type 2 diabetes mellitus without complications: Secondary | ICD-10-CM | POA: Diagnosis present

## 2017-03-11 DIAGNOSIS — E1121 Type 2 diabetes mellitus with diabetic nephropathy: Secondary | ICD-10-CM | POA: Insufficient documentation

## 2017-03-11 DIAGNOSIS — M545 Low back pain: Secondary | ICD-10-CM | POA: Diagnosis not present

## 2017-03-11 DIAGNOSIS — Z7984 Long term (current) use of oral hypoglycemic drugs: Secondary | ICD-10-CM | POA: Diagnosis not present

## 2017-03-11 NOTE — Progress Notes (Signed)
Medical Nutrition Therapy Follow-up visit:  Time with patient: 8:45 -9:45am Visit #: 3 ASSESSMENT:  Diagnosis: Type 2 Diabetes  Current weight:179.8 lbs    Height:59 in Medications: See list Medical History: hypertension, fibromyalgia, elevated liver enzymes, elevated protein in urine Progress and evaluation: Patient in for medical nutrition follow-up. She has continued to have a consistent meal pattern of 3 meals and reports she feels better throughout the day and has not experienced symptoms of hypoglycemia.  She continues to use a phone app to record foods/beverages. She has increased her vegetable and fruit intake and typically averages 3 servings daily. Her carbohydrate intake does not exceed recommended amounts. Her protein intake is less than recommended minimum of 60 gms on several days weekly. She is including whole grains on a daily basis and 2 milk products on most days. Her InBody Composition results showed an increase in muscle mass and a decrease in % of body fat although weight had increased by 1.7 lbs. She is consistently doing cardio by walking at least 10,000 steps daily and is using bands for strength training. She has not received her diabetes supplies and states she forgot to check about this but will do so this week.   NUTRITION CARE EDUCATION: Basic nutrition: basic food groups, appropriate nutrient balance, appropriate meal and snack schedule, general nutrition guidelines. Compared her food records to basic food group servings needed to meet nutrient needs. Discussed how snacks choices can help meet the food groups that are less than recommended amounts.    Reviewed  InBody Body Composition results and discussed how consistent exercise can help continue to decrease body fat and increase muscle mass.  INTERVENTION: Continue with 3 meal per day schedule. Include 2-3 milk products daily ( low fat milk, cheese, yogurt). Include 3 whole grains daily (100% whole wheat  bread, 1/2 cup oatmeal, 1/2 cup corn, 3-4 cups popcorn, 1/2 cup whole wheat pasta) 5 servings of fruits/vegetables (Count 1/2 cup of vegetables as 1 serving). 6 oz of protein ( 1 egg = 1 oz, 1/4 cup nuts, 2 Tbsp peanut butter, meat the size of a deck of cards = 3 oz.), 1 oz of cheese. Goal is minimum of 60 grams daily.  Check about diabetes supplies.   EDUCATION MATERIALS GIVEN:  . Goals/ instructions .  LEARNER/ who was taught:  . Patient  LEVEL OF UNDERSTANDING: . Verbalizes/ demonstrates competency Demonstrated degree of understanding via teach back.  LEARNING BARRIERS: . None WILLINGNESS TO LEARN/READINESS FOR CHANGE: . Change in Progress  MONITORING AND EVALUATION:   Diet, exercise, weight, body composition Follow-up: 05/06/17 at 9:00am

## 2017-03-11 NOTE — Patient Instructions (Signed)
Continue with 3 meal per day schedule. Include 2-3 milk products daily ( low fat milk, cheese, yogurt). Include 3 whole grains daily (100% whole wheat bread, 1/2 cup oatmeal, 1/2 cup corn, 3-4 cups popcorn, 1/2 cup whole wheat pasta) 5 servings of fruits/vegetables (Count 1/2 cup of vegetables as 1 serving). 6 oz of protein ( 1 egg = 1 oz, 1/4 cup nuts, 2 Tbsp peanut butter, meat the size of a deck of cards = 3 oz.), 1 oz of cheese. Goal is minimum of 60 grams daily.  Check about diabetes supplies.

## 2017-05-06 ENCOUNTER — Ambulatory Visit: Payer: PPO | Admitting: Dietician

## 2017-06-12 ENCOUNTER — Ambulatory Visit: Payer: PPO | Admitting: Dietician

## 2017-06-12 DIAGNOSIS — E1142 Type 2 diabetes mellitus with diabetic polyneuropathy: Secondary | ICD-10-CM | POA: Diagnosis not present

## 2017-06-12 DIAGNOSIS — I129 Hypertensive chronic kidney disease with stage 1 through stage 4 chronic kidney disease, or unspecified chronic kidney disease: Secondary | ICD-10-CM | POA: Diagnosis not present

## 2017-06-12 DIAGNOSIS — E7800 Pure hypercholesterolemia, unspecified: Secondary | ICD-10-CM | POA: Diagnosis not present

## 2017-06-12 DIAGNOSIS — N181 Chronic kidney disease, stage 1: Secondary | ICD-10-CM | POA: Diagnosis not present

## 2017-06-12 DIAGNOSIS — E1121 Type 2 diabetes mellitus with diabetic nephropathy: Secondary | ICD-10-CM | POA: Diagnosis not present

## 2017-06-24 ENCOUNTER — Ambulatory Visit: Payer: PPO | Admitting: Dietician

## 2017-07-07 ENCOUNTER — Encounter: Payer: Self-pay | Admitting: Dietician

## 2017-08-05 DIAGNOSIS — H40003 Preglaucoma, unspecified, bilateral: Secondary | ICD-10-CM | POA: Diagnosis not present

## 2017-08-21 DIAGNOSIS — H40003 Preglaucoma, unspecified, bilateral: Secondary | ICD-10-CM | POA: Diagnosis not present

## 2017-09-23 ENCOUNTER — Other Ambulatory Visit: Payer: Self-pay | Admitting: Family Medicine

## 2017-09-23 DIAGNOSIS — Z1231 Encounter for screening mammogram for malignant neoplasm of breast: Secondary | ICD-10-CM

## 2017-10-16 ENCOUNTER — Ambulatory Visit
Admission: RE | Admit: 2017-10-16 | Discharge: 2017-10-16 | Disposition: A | Discharge status: Home / Self Care | Payer: PPO | Source: Ambulatory Visit | Attending: Family Medicine | Admitting: Family Medicine

## 2017-10-16 DIAGNOSIS — Z1231 Encounter for screening mammogram for malignant neoplasm of breast: Secondary | ICD-10-CM | POA: Diagnosis not present

## 2018-03-24 ENCOUNTER — Other Ambulatory Visit: Payer: Self-pay | Admitting: Family Medicine

## 2018-03-24 DIAGNOSIS — G720 Drug-induced myopathy: Secondary | ICD-10-CM | POA: Diagnosis not present

## 2018-03-24 DIAGNOSIS — Z1389 Encounter for screening for other disorder: Secondary | ICD-10-CM | POA: Diagnosis not present

## 2018-03-24 DIAGNOSIS — Z1159 Encounter for screening for other viral diseases: Secondary | ICD-10-CM | POA: Diagnosis not present

## 2018-03-24 DIAGNOSIS — F39 Unspecified mood [affective] disorder: Secondary | ICD-10-CM | POA: Diagnosis not present

## 2018-03-24 DIAGNOSIS — Z0000 Encounter for general adult medical examination without abnormal findings: Secondary | ICD-10-CM | POA: Diagnosis not present

## 2018-03-24 DIAGNOSIS — N181 Chronic kidney disease, stage 1: Secondary | ICD-10-CM | POA: Diagnosis not present

## 2018-03-24 DIAGNOSIS — E1121 Type 2 diabetes mellitus with diabetic nephropathy: Secondary | ICD-10-CM | POA: Diagnosis not present

## 2018-03-24 DIAGNOSIS — E1142 Type 2 diabetes mellitus with diabetic polyneuropathy: Secondary | ICD-10-CM | POA: Diagnosis not present

## 2018-03-24 DIAGNOSIS — I129 Hypertensive chronic kidney disease with stage 1 through stage 4 chronic kidney disease, or unspecified chronic kidney disease: Secondary | ICD-10-CM | POA: Diagnosis not present

## 2018-03-24 DIAGNOSIS — J309 Allergic rhinitis, unspecified: Secondary | ICD-10-CM | POA: Diagnosis not present

## 2018-03-24 DIAGNOSIS — M797 Fibromyalgia: Secondary | ICD-10-CM | POA: Diagnosis not present

## 2018-03-24 DIAGNOSIS — E7800 Pure hypercholesterolemia, unspecified: Secondary | ICD-10-CM | POA: Diagnosis not present

## 2018-03-24 DIAGNOSIS — K219 Gastro-esophageal reflux disease without esophagitis: Secondary | ICD-10-CM | POA: Diagnosis not present

## 2018-03-24 DIAGNOSIS — Z13820 Encounter for screening for osteoporosis: Secondary | ICD-10-CM

## 2018-05-28 ENCOUNTER — Other Ambulatory Visit: Payer: PPO

## 2018-06-18 DIAGNOSIS — R945 Abnormal results of liver function studies: Secondary | ICD-10-CM | POA: Diagnosis not present

## 2018-06-18 DIAGNOSIS — R740 Nonspecific elevation of levels of transaminase and lactic acid dehydrogenase [LDH]: Secondary | ICD-10-CM | POA: Diagnosis not present

## 2018-06-23 DIAGNOSIS — H40003 Preglaucoma, unspecified, bilateral: Secondary | ICD-10-CM | POA: Diagnosis not present

## 2018-06-25 ENCOUNTER — Other Ambulatory Visit: Payer: PPO

## 2018-08-13 ENCOUNTER — Other Ambulatory Visit: Payer: PPO

## 2018-09-16 ENCOUNTER — Other Ambulatory Visit: Payer: Self-pay | Admitting: Family Medicine

## 2018-09-16 DIAGNOSIS — Z1231 Encounter for screening mammogram for malignant neoplasm of breast: Secondary | ICD-10-CM

## 2018-09-23 DIAGNOSIS — E1121 Type 2 diabetes mellitus with diabetic nephropathy: Secondary | ICD-10-CM | POA: Diagnosis not present

## 2018-09-23 DIAGNOSIS — I129 Hypertensive chronic kidney disease with stage 1 through stage 4 chronic kidney disease, or unspecified chronic kidney disease: Secondary | ICD-10-CM | POA: Diagnosis not present

## 2018-09-23 DIAGNOSIS — E7800 Pure hypercholesterolemia, unspecified: Secondary | ICD-10-CM | POA: Diagnosis not present

## 2018-09-23 DIAGNOSIS — R748 Abnormal levels of other serum enzymes: Secondary | ICD-10-CM | POA: Diagnosis not present

## 2018-09-23 DIAGNOSIS — G720 Drug-induced myopathy: Secondary | ICD-10-CM | POA: Diagnosis not present

## 2018-09-23 DIAGNOSIS — N181 Chronic kidney disease, stage 1: Secondary | ICD-10-CM | POA: Diagnosis not present

## 2018-09-23 DIAGNOSIS — E669 Obesity, unspecified: Secondary | ICD-10-CM | POA: Diagnosis not present

## 2018-10-01 ENCOUNTER — Other Ambulatory Visit: Payer: PPO

## 2018-10-27 DIAGNOSIS — E1121 Type 2 diabetes mellitus with diabetic nephropathy: Secondary | ICD-10-CM | POA: Diagnosis not present

## 2018-10-29 ENCOUNTER — Ambulatory Visit: Payer: PPO

## 2018-12-22 DIAGNOSIS — H40003 Preglaucoma, unspecified, bilateral: Secondary | ICD-10-CM | POA: Diagnosis not present

## 2018-12-29 DIAGNOSIS — H40003 Preglaucoma, unspecified, bilateral: Secondary | ICD-10-CM | POA: Diagnosis not present

## 2019-03-30 DIAGNOSIS — I129 Hypertensive chronic kidney disease with stage 1 through stage 4 chronic kidney disease, or unspecified chronic kidney disease: Secondary | ICD-10-CM | POA: Diagnosis not present

## 2019-03-30 DIAGNOSIS — G720 Drug-induced myopathy: Secondary | ICD-10-CM | POA: Diagnosis not present

## 2019-03-30 DIAGNOSIS — Z0000 Encounter for general adult medical examination without abnormal findings: Secondary | ICD-10-CM | POA: Diagnosis not present

## 2019-03-30 DIAGNOSIS — E7800 Pure hypercholesterolemia, unspecified: Secondary | ICD-10-CM | POA: Diagnosis not present

## 2019-03-30 DIAGNOSIS — N181 Chronic kidney disease, stage 1: Secondary | ICD-10-CM | POA: Diagnosis not present

## 2019-03-30 DIAGNOSIS — Z1389 Encounter for screening for other disorder: Secondary | ICD-10-CM | POA: Diagnosis not present

## 2019-03-30 DIAGNOSIS — E1121 Type 2 diabetes mellitus with diabetic nephropathy: Secondary | ICD-10-CM | POA: Diagnosis not present

## 2019-03-30 DIAGNOSIS — E1142 Type 2 diabetes mellitus with diabetic polyneuropathy: Secondary | ICD-10-CM | POA: Diagnosis not present

## 2019-03-30 DIAGNOSIS — E669 Obesity, unspecified: Secondary | ICD-10-CM | POA: Diagnosis not present

## 2019-03-30 DIAGNOSIS — F39 Unspecified mood [affective] disorder: Secondary | ICD-10-CM | POA: Diagnosis not present

## 2019-03-30 DIAGNOSIS — K219 Gastro-esophageal reflux disease without esophagitis: Secondary | ICD-10-CM | POA: Diagnosis not present

## 2019-03-30 DIAGNOSIS — J309 Allergic rhinitis, unspecified: Secondary | ICD-10-CM | POA: Diagnosis not present

## 2019-09-29 DIAGNOSIS — Z7189 Other specified counseling: Secondary | ICD-10-CM | POA: Diagnosis not present

## 2019-09-29 DIAGNOSIS — G720 Drug-induced myopathy: Secondary | ICD-10-CM | POA: Diagnosis not present

## 2019-09-29 DIAGNOSIS — I129 Hypertensive chronic kidney disease with stage 1 through stage 4 chronic kidney disease, or unspecified chronic kidney disease: Secondary | ICD-10-CM | POA: Diagnosis not present

## 2019-09-29 DIAGNOSIS — E1142 Type 2 diabetes mellitus with diabetic polyneuropathy: Secondary | ICD-10-CM | POA: Diagnosis not present

## 2019-09-29 DIAGNOSIS — N181 Chronic kidney disease, stage 1: Secondary | ICD-10-CM | POA: Diagnosis not present

## 2019-09-29 DIAGNOSIS — E1121 Type 2 diabetes mellitus with diabetic nephropathy: Secondary | ICD-10-CM | POA: Diagnosis not present

## 2019-09-29 DIAGNOSIS — E7800 Pure hypercholesterolemia, unspecified: Secondary | ICD-10-CM | POA: Diagnosis not present

## 2020-01-24 DIAGNOSIS — E119 Type 2 diabetes mellitus without complications: Secondary | ICD-10-CM | POA: Diagnosis not present

## 2020-01-27 DIAGNOSIS — Z23 Encounter for immunization: Secondary | ICD-10-CM | POA: Diagnosis not present

## 2020-04-03 DIAGNOSIS — N181 Chronic kidney disease, stage 1: Secondary | ICD-10-CM | POA: Diagnosis not present

## 2020-04-03 DIAGNOSIS — J309 Allergic rhinitis, unspecified: Secondary | ICD-10-CM | POA: Diagnosis not present

## 2020-04-03 DIAGNOSIS — I129 Hypertensive chronic kidney disease with stage 1 through stage 4 chronic kidney disease, or unspecified chronic kidney disease: Secondary | ICD-10-CM | POA: Diagnosis not present

## 2020-04-03 DIAGNOSIS — Z23 Encounter for immunization: Secondary | ICD-10-CM | POA: Diagnosis not present

## 2020-04-03 DIAGNOSIS — Z0000 Encounter for general adult medical examination without abnormal findings: Secondary | ICD-10-CM | POA: Diagnosis not present

## 2020-04-03 DIAGNOSIS — K219 Gastro-esophageal reflux disease without esophagitis: Secondary | ICD-10-CM | POA: Diagnosis not present

## 2020-04-03 DIAGNOSIS — Z1389 Encounter for screening for other disorder: Secondary | ICD-10-CM | POA: Diagnosis not present

## 2020-04-03 DIAGNOSIS — E7800 Pure hypercholesterolemia, unspecified: Secondary | ICD-10-CM | POA: Diagnosis not present

## 2020-04-03 DIAGNOSIS — E1142 Type 2 diabetes mellitus with diabetic polyneuropathy: Secondary | ICD-10-CM | POA: Diagnosis not present

## 2020-04-03 DIAGNOSIS — F39 Unspecified mood [affective] disorder: Secondary | ICD-10-CM | POA: Diagnosis not present

## 2020-04-03 DIAGNOSIS — E1121 Type 2 diabetes mellitus with diabetic nephropathy: Secondary | ICD-10-CM | POA: Diagnosis not present

## 2020-04-03 DIAGNOSIS — M797 Fibromyalgia: Secondary | ICD-10-CM | POA: Diagnosis not present

## 2020-07-24 DIAGNOSIS — H40003 Preglaucoma, unspecified, bilateral: Secondary | ICD-10-CM | POA: Diagnosis not present

## 2020-07-31 DIAGNOSIS — H40003 Preglaucoma, unspecified, bilateral: Secondary | ICD-10-CM | POA: Diagnosis not present

## 2020-10-11 DIAGNOSIS — E7800 Pure hypercholesterolemia, unspecified: Secondary | ICD-10-CM | POA: Diagnosis not present

## 2020-10-11 DIAGNOSIS — E1142 Type 2 diabetes mellitus with diabetic polyneuropathy: Secondary | ICD-10-CM | POA: Diagnosis not present

## 2020-10-11 DIAGNOSIS — E1169 Type 2 diabetes mellitus with other specified complication: Secondary | ICD-10-CM | POA: Diagnosis not present

## 2020-10-11 DIAGNOSIS — E1121 Type 2 diabetes mellitus with diabetic nephropathy: Secondary | ICD-10-CM | POA: Diagnosis not present

## 2020-10-11 DIAGNOSIS — I129 Hypertensive chronic kidney disease with stage 1 through stage 4 chronic kidney disease, or unspecified chronic kidney disease: Secondary | ICD-10-CM | POA: Diagnosis not present

## 2020-10-11 DIAGNOSIS — G720 Drug-induced myopathy: Secondary | ICD-10-CM | POA: Diagnosis not present

## 2020-10-11 DIAGNOSIS — N3090 Cystitis, unspecified without hematuria: Secondary | ICD-10-CM | POA: Diagnosis not present

## 2020-10-11 DIAGNOSIS — N181 Chronic kidney disease, stage 1: Secondary | ICD-10-CM | POA: Diagnosis not present

## 2020-10-11 DIAGNOSIS — R3915 Urgency of urination: Secondary | ICD-10-CM | POA: Diagnosis not present

## 2020-10-25 DIAGNOSIS — R350 Frequency of micturition: Secondary | ICD-10-CM | POA: Diagnosis not present

## 2021-02-09 DIAGNOSIS — E113293 Type 2 diabetes mellitus with mild nonproliferative diabetic retinopathy without macular edema, bilateral: Secondary | ICD-10-CM | POA: Diagnosis not present

## 2021-02-26 ENCOUNTER — Other Ambulatory Visit: Payer: Self-pay | Admitting: Family Medicine

## 2021-02-26 DIAGNOSIS — Z1231 Encounter for screening mammogram for malignant neoplasm of breast: Secondary | ICD-10-CM

## 2021-02-28 ENCOUNTER — Other Ambulatory Visit: Payer: Self-pay | Admitting: *Deleted

## 2021-02-28 DIAGNOSIS — I739 Peripheral vascular disease, unspecified: Secondary | ICD-10-CM

## 2021-03-02 ENCOUNTER — Other Ambulatory Visit: Payer: Self-pay

## 2021-03-07 ENCOUNTER — Ambulatory Visit (HOSPITAL_COMMUNITY)
Admission: RE | Admit: 2021-03-07 | Discharge: 2021-03-07 | Disposition: A | Discharge status: Home / Self Care | Payer: PPO | Source: Ambulatory Visit | Attending: Vascular Surgery | Admitting: Vascular Surgery

## 2021-03-07 ENCOUNTER — Ambulatory Visit: Payer: PPO | Admitting: Vascular Surgery

## 2021-03-07 ENCOUNTER — Other Ambulatory Visit: Payer: Self-pay

## 2021-03-07 ENCOUNTER — Encounter: Payer: Self-pay | Admitting: Vascular Surgery

## 2021-03-07 VITALS — BP 164/98 | HR 90 | Temp 98.2°F | Resp 20 | Ht 59.0 in | Wt 155.0 lb

## 2021-03-07 DIAGNOSIS — M79605 Pain in left leg: Secondary | ICD-10-CM | POA: Diagnosis not present

## 2021-03-07 DIAGNOSIS — M79604 Pain in right leg: Secondary | ICD-10-CM | POA: Diagnosis not present

## 2021-03-07 DIAGNOSIS — I8393 Asymptomatic varicose veins of bilateral lower extremities: Secondary | ICD-10-CM

## 2021-03-07 DIAGNOSIS — I739 Peripheral vascular disease, unspecified: Secondary | ICD-10-CM | POA: Diagnosis not present

## 2021-03-07 NOTE — Progress Notes (Signed)
Patient ID: Kathleen Hickman, female   DOB: Jul 16, 1955, 65 y.o.   MRN: [id number]  Reason for Consult: New Patient (Initial Visit)   Referred by Kelton Pillar, MD  Subjective:     HPI:  Kathleen Hickman is a 65 y.o. female without significant vascular disease.  She does have a father who had significant heart disease and also she has diabetes, hypertension and hyperlipidemia.  For these reason she does take aspirin.  She has significant lower extremity telangiectasias which have only appeared in the last couple years.  She has been working out 6 days a week at [employer] and has recently lost 40 pounds.  Her mother did have significant varicosities bilaterally and her sister also has varicose veins.  She denies any history of DVT and does not have varicose veins herself. she did cut her leg while shaving at the area of the telangiectasias on the right side.  Past Medical History:  Diagnosis Date   Diabetes mellitus without complication ([place])    Hyperlipidemia    Hypertension    Family History  Problem Relation Age of Onset   Breast cancer Neg Hx    Past Surgical History:  Procedure Laterality Date   CARPAL TUNNEL WITH CUBITAL TUNNEL Right    CHOLECYSTECTOMY     HAND SURGERY Right    fracture repair in 1990   SHOULDER SURGERY Right    SPINE SURGERY     TUBAL LIGATION      Short Social History:  Social History   Tobacco Use   Smoking status: Never   Smokeless tobacco: Never  Substance Use Topics   Alcohol use: Not on file    Allergies  Allergen Reactions   Morphine And Related Anaphylaxis   Aspirin Nausea Only    Pt states can take enteric coated low-dose aspirin.   Ciprofloxacin Hives   Codeine Other (See Comments)    Thrush in the mouth   Elemental Sulfur Hives   Influenza Vaccines     Knot at injection site    Current Outpatient Medications  Medication Sig Dispense Refill   aspirin EC 81 MG tablet Take 81 mg by mouth daily.     benazepril (LOTENSIN)  10 MG tablet Take 10 mg by mouth daily.     gabapentin (NEURONTIN) 300 MG capsule Take 300 mg by mouth 3 (three) times daily.     latanoprost (XALATAN) 0.005 % ophthalmic solution 1 drop at bedtime.     metFORMIN (GLUCOPHAGE) 500 MG tablet Take by mouth 2 (two) times daily with a meal.     metoprolol succinate (TOPROL-XL) 25 MG 24 hr tablet Take 25 mg by mouth daily.     Multiple Vitamins-Minerals (MULTI FOR HER PO) Take by mouth.     nabumetone (RELAFEN) 750 MG tablet Take 750 mg by mouth daily.     nitrofurantoin, macrocrystal-monohydrate, (MACROBID) 100 MG capsule Take 100 mg by mouth at bedtime.     sertraline (ZOLOFT) 50 MG tablet Take 50 mg by mouth daily.     traMADol (ULTRAM) 50 MG tablet Take by mouth every 6 (six) hours as needed.     zolpidem (AMBIEN) 10 MG tablet Take 10 mg by mouth at bedtime as needed for sleep.     No current facility-administered medications for this visit.    Review of Systems  Constitutional:  Constitutional negative. HENT: HENT negative.  Eyes: Eyes negative.  Respiratory: Respiratory negative.  Cardiovascular: Cardiovascular negative.  GI: Gastrointestinal negative.  Musculoskeletal:  Positive for leg pain.  Skin:       Spider veins Neurological: Neurological negative. Hematologic: Hematologic/lymphatic negative.  Psychiatric: Psychiatric negative.       Objective:  Objective  Vitals:   03/07/21 1125  BP: (!) 164/98  Pulse: 90  Resp: 20  Temp: 98.2 F (36.8 C)  SpO2: 95%    Physical Exam HENT:     Head: Normocephalic.     Nose:     Comments: Wearing a mask Eyes:     Pupils: Pupils are equal, round, and reactive to light.  Cardiovascular:     Rate and Rhythm: Normal rate.  Pulmonary:     Effort: Pulmonary effort is normal.  Abdominal:     General: Abdomen is flat.     Palpations: Abdomen is soft.  Musculoskeletal:        General: Normal range of motion.     Cervical back: Normal range of motion.     Right lower leg: No  edema.     Left lower leg: No edema.  Skin:    Capillary Refill: Capillary refill takes less than 2 seconds.     Comments: Bilateral lower extremity spider veins with large clusters on lateral legs bilaterally.  Neurological:     General: No focal deficit present.     Mental Status: She is alert.  Psychiatric:        Mood and Affect: Mood normal.        Behavior: Behavior normal.        Thought Content: Thought content normal.        Judgment: Judgment normal.    Data: ABI Findings:  +---------+------------------+-----+---------+--------+  Right    Rt Pressure (mmHg)IndexWaveform Comment   +---------+------------------+-----+---------+--------+  Brachial 184                                       +---------+------------------+-----+---------+--------+  PTA      235               1.26 triphasic          +---------+------------------+-----+---------+--------+  DP       235               1.26 triphasic          +---------+------------------+-----+---------+--------+  Great Toe176               0.94 Normal             +---------+------------------+-----+---------+--------+   +---------+------------------+-----+---------+-------+  Left     Lt Pressure (mmHg)IndexWaveform Comment  +---------+------------------+-----+---------+-------+  Brachial 187                                      +---------+------------------+-----+---------+-------+  PTA      228               1.22 triphasic         +---------+------------------+-----+---------+-------+  DP       247               1.32 triphasic         +---------+------------------+-----+---------+-------+  Great Toe201               1.07 Normal            +---------+------------------+-----+---------+-------+   +-------+-----------+-----------+------------+------------+  ABI/TBIToday's ABIToday's  TBIPrevious ABIPrevious TBI   +-------+-----------+-----------+------------+------------+  Right  1.26       0.94                                 +-------+-----------+-----------+------------+------------+  Left   0.32       1.07                                 +-------+-----------+-----------+------------+------------+        Assessment/Plan:     65 year old female with history of bilateral lower extremity telangiectasias including 1 area where she cut herself shaving in the past and this has been somewhat painful to her.  I discussed with her her options being compression stockings and no therapy versus obtaining lower extremity reflux studies versus primary sclerotherapy.  She does not appear to have any evidence of reflux with varicosities and so would be best treated with sclerotherapy.  She has discussed with Kathleen Hickman and likely would require 2 vials of the sclerosant.  She was given her business card and will call to schedule when she is ready.     Kathleen Sandy MD [facility]

## 2021-05-16 DIAGNOSIS — L814 Other melanin hyperpigmentation: Secondary | ICD-10-CM | POA: Diagnosis not present

## 2021-05-16 DIAGNOSIS — L820 Inflamed seborrheic keratosis: Secondary | ICD-10-CM | POA: Diagnosis not present

## 2021-05-16 DIAGNOSIS — D485 Neoplasm of uncertain behavior of skin: Secondary | ICD-10-CM | POA: Diagnosis not present

## 2021-05-16 DIAGNOSIS — D225 Melanocytic nevi of trunk: Secondary | ICD-10-CM | POA: Diagnosis not present

## 2021-05-16 DIAGNOSIS — L728 Other follicular cysts of the skin and subcutaneous tissue: Secondary | ICD-10-CM | POA: Diagnosis not present

## 2021-05-16 DIAGNOSIS — L821 Other seborrheic keratosis: Secondary | ICD-10-CM | POA: Diagnosis not present

## 2021-05-16 DIAGNOSIS — R208 Other disturbances of skin sensation: Secondary | ICD-10-CM | POA: Diagnosis not present

## 2021-05-16 DIAGNOSIS — L538 Other specified erythematous conditions: Secondary | ICD-10-CM | POA: Diagnosis not present

## 2021-05-16 DIAGNOSIS — L570 Actinic keratosis: Secondary | ICD-10-CM | POA: Diagnosis not present

## 2021-05-23 DIAGNOSIS — E1121 Type 2 diabetes mellitus with diabetic nephropathy: Secondary | ICD-10-CM | POA: Diagnosis not present

## 2021-05-23 DIAGNOSIS — E11319 Type 2 diabetes mellitus with unspecified diabetic retinopathy without macular edema: Secondary | ICD-10-CM | POA: Diagnosis not present

## 2021-05-23 DIAGNOSIS — Z0000 Encounter for general adult medical examination without abnormal findings: Secondary | ICD-10-CM | POA: Diagnosis not present

## 2021-05-23 DIAGNOSIS — I868 Varicose veins of other specified sites: Secondary | ICD-10-CM | POA: Diagnosis not present

## 2021-05-23 DIAGNOSIS — Z1389 Encounter for screening for other disorder: Secondary | ICD-10-CM | POA: Diagnosis not present

## 2021-05-23 DIAGNOSIS — G720 Drug-induced myopathy: Secondary | ICD-10-CM | POA: Diagnosis not present

## 2021-05-23 DIAGNOSIS — G5790 Unspecified mononeuropathy of unspecified lower limb: Secondary | ICD-10-CM | POA: Diagnosis not present

## 2021-05-23 DIAGNOSIS — N181 Chronic kidney disease, stage 1: Secondary | ICD-10-CM | POA: Diagnosis not present

## 2021-05-23 DIAGNOSIS — F39 Unspecified mood [affective] disorder: Secondary | ICD-10-CM | POA: Diagnosis not present

## 2021-05-23 DIAGNOSIS — Z23 Encounter for immunization: Secondary | ICD-10-CM | POA: Diagnosis not present

## 2021-05-23 DIAGNOSIS — I129 Hypertensive chronic kidney disease with stage 1 through stage 4 chronic kidney disease, or unspecified chronic kidney disease: Secondary | ICD-10-CM | POA: Diagnosis not present

## 2021-05-23 DIAGNOSIS — Z7984 Long term (current) use of oral hypoglycemic drugs: Secondary | ICD-10-CM | POA: Diagnosis not present

## 2021-05-23 DIAGNOSIS — E7800 Pure hypercholesterolemia, unspecified: Secondary | ICD-10-CM | POA: Diagnosis not present

## 2021-05-29 ENCOUNTER — Other Ambulatory Visit: Payer: Self-pay | Admitting: Family Medicine

## 2021-05-29 DIAGNOSIS — E2839 Other primary ovarian failure: Secondary | ICD-10-CM

## 2021-07-10 ENCOUNTER — Other Ambulatory Visit: Payer: PPO

## 2021-08-14 DIAGNOSIS — H40003 Preglaucoma, unspecified, bilateral: Secondary | ICD-10-CM | POA: Diagnosis not present

## 2021-08-21 DIAGNOSIS — H401131 Primary open-angle glaucoma, bilateral, mild stage: Secondary | ICD-10-CM | POA: Diagnosis not present

## 2021-08-27 ENCOUNTER — Ambulatory Visit
Admission: RE | Admit: 2021-08-27 | Discharge: 2021-08-27 | Disposition: A | Discharge status: Home / Self Care | Payer: PPO | Source: Ambulatory Visit | Attending: Family Medicine | Admitting: Family Medicine

## 2021-08-27 DIAGNOSIS — E2839 Other primary ovarian failure: Secondary | ICD-10-CM

## 2021-08-27 DIAGNOSIS — Z1231 Encounter for screening mammogram for malignant neoplasm of breast: Secondary | ICD-10-CM | POA: Diagnosis not present

## 2021-08-27 DIAGNOSIS — Z780 Asymptomatic menopausal state: Secondary | ICD-10-CM | POA: Diagnosis not present

## 2021-08-27 DIAGNOSIS — M85832 Other specified disorders of bone density and structure, left forearm: Secondary | ICD-10-CM | POA: Diagnosis not present

## 2021-09-13 ENCOUNTER — Telehealth: Payer: Self-pay

## 2021-09-13 ENCOUNTER — Other Ambulatory Visit: Payer: Self-pay

## 2021-09-13 DIAGNOSIS — M79604 Pain in right leg: Secondary | ICD-10-CM

## 2021-09-13 DIAGNOSIS — I8393 Asymptomatic varicose veins of bilateral lower extremities: Secondary | ICD-10-CM

## 2021-09-13 NOTE — Telephone Encounter (Signed)
Pt called with worsening RLE that she feels at all times, even at rest. She has been scheduled for a reflux study tomorrow and will see APP in 2 weeks. Pt is aware of these appts.

## 2021-09-14 ENCOUNTER — Ambulatory Visit (HOSPITAL_COMMUNITY)
Admission: RE | Admit: 2021-09-14 | Discharge: 2021-09-14 | Disposition: A | Discharge status: Home / Self Care | Payer: PPO | Source: Ambulatory Visit | Attending: Vascular Surgery | Admitting: Vascular Surgery

## 2021-09-14 DIAGNOSIS — M79605 Pain in left leg: Secondary | ICD-10-CM | POA: Diagnosis not present

## 2021-09-14 DIAGNOSIS — M79604 Pain in right leg: Secondary | ICD-10-CM | POA: Insufficient documentation

## 2021-09-14 DIAGNOSIS — I8393 Asymptomatic varicose veins of bilateral lower extremities: Secondary | ICD-10-CM | POA: Diagnosis not present

## 2021-09-19 NOTE — Progress Notes (Signed)
[facility]           [facility] [place]  History and Physical   Kathleen Hickman is a 66 y.o. female who was seen in November 2022 by Dr. Donzetta Matters for bilateral lower extremity telangiectasias.  He discussed compression socks vs sclerotherapy and she would call when and if she was ready.    She presents today with continued pain around the area of clustered spider veins on the lateral aspect of the right leg.  She states that years ago, she cut this area shaving and it bled but has not bled since that time.  She has increased pain around that area.  She is very active and states that this will cause her to have to sit due to pain.  Ice pack to the area helps.  She states that her legs do ache as well.  She does not have any hx of DVT.   She exercises several times per week and has lost over 30lbs intentionally.  She does wear her compression socks that are knee high.  She puts them on in the morning and takes them off at night.    The pt is not on a statin for cholesterol management.  The pt is on a daily aspirin.   Other AC:  none The pt is on BB, ACEI for hypertension.   The pt is diabetic.   Tobacco hx:  never  She does not have family hx of AAA.   Past Medical History:  Diagnosis Date   Diabetes mellitus without complication ([place])    Hyperlipidemia    Hypertension     Past Surgical History:  Procedure Laterality Date   CARPAL TUNNEL WITH CUBITAL TUNNEL Right    CHOLECYSTECTOMY     HAND SURGERY Right    fracture repair in 1990   SHOULDER SURGERY Right    SPINE SURGERY     TUBAL LIGATION      Social History   Socioeconomic History   Marital status: Single    Spouse name: Not on file   Number of children: Not on file   Years of education: Not on file   Highest education level: Not on file  Occupational History   Not on file  Tobacco Use   Smoking status: Never   Smokeless tobacco: Never  Vaping Use   Vaping Use: Never used  Substance and  Sexual Activity   Alcohol use: Not on file   Drug use: Not on file   Sexual activity: Not on file  Other Topics Concern   Not on file  Social History Narrative   Not on file   Social Determinants of Health   Financial Resource Strain: Not on file  Food Insecurity: Not on file  Transportation Needs: Not on file  Physical Activity: Not on file  Stress: Not on file  Social Connections: Not on file  Intimate Partner Violence: Not on file     Family History  Problem Relation Age of Onset   Breast cancer Neg Hx     Current Outpatient Medications  Medication Sig Dispense Refill   aspirin EC 81 MG tablet Take 81 mg by mouth daily.     benazepril (LOTENSIN) 10 MG tablet Take 10 mg by mouth daily.     gabapentin (NEURONTIN) 300 MG capsule Take 300 mg by mouth 3 (three) times daily.     latanoprost (XALATAN) 0.005 % ophthalmic solution 1 drop at bedtime.  metFORMIN (GLUCOPHAGE) 500 MG tablet Take by mouth 2 (two) times daily with a meal.     metoprolol succinate (TOPROL-XL) 25 MG 24 hr tablet Take 25 mg by mouth daily.     Multiple Vitamins-Minerals (MULTI FOR HER PO) Take by mouth.     nabumetone (RELAFEN) 750 MG tablet Take 750 mg by mouth daily.     nitrofurantoin, macrocrystal-monohydrate, (MACROBID) 100 MG capsule Take 100 mg by mouth at bedtime.     sertraline (ZOLOFT) 50 MG tablet Take 50 mg by mouth daily.     traMADol (ULTRAM) 50 MG tablet Take by mouth every 6 (six) hours as needed.     zolpidem (AMBIEN) 10 MG tablet Take 10 mg by mouth at bedtime as needed for sleep.     No current facility-administered medications for this visit.    Allergies  Allergen Reactions   Morphine And Related Anaphylaxis   Aspirin Nausea Only    Pt states can take enteric coated low-dose aspirin.   Ciprofloxacin Hives   Codeine Other (See Comments)    Thrush in the mouth   Elemental Sulfur Hives   Influenza Vaccines     Knot at injection site    REVIEW OF SYSTEMS:   '[X]'$  denotes  positive finding, '[ ]'$  denotes negative finding Cardiac  Comments:  Chest pain or chest pressure:    Shortness of breath upon exertion:    Short of breath when lying flat:    Irregular heart rhythm:        Vascular    Pain in calf, thigh, or hip brought on by ambulation:    Pain in feet at night that wakes you up from your sleep:     Blood clot in your veins:    Leg swelling:  x See HPI      Pulmonary    Oxygen at home:    Productive cough:     Wheezing:         Neurologic    Sudden weakness in arms or legs:     Sudden numbness in arms or legs:     Sudden onset of difficulty speaking or slurred speech:    Temporary loss of vision in one eye:     Problems with dizziness:         Gastrointestinal    Blood in stool:     Vomited blood:         Genitourinary    Burning when urinating:     Blood in urine:        Psychiatric    Major depression:         Hematologic    Bleeding problems:    Problems with blood clotting too easily:        Skin    Rashes or ulcers:        Constitutional    Fever or chills:      PHYSICAL EXAMINATION:  Today's Vitals   09/27/21 0921 09/27/21 0924  BP: (!) 184/81 (!) 172/79  Pulse: 77   Resp: 18   Temp: (!) 97.5 F (36.4 C)   TempSrc: Oral   SpO2: 99%   Weight: 166 lb 3.2 oz (75.4 kg)   Height: '4\' 11"'$  (1.499 m)   PainSc:  5    Body mass index is 33.57 kg/m.   General:  WDWN in NAD; vital signs documented above Gait: Not observed HENT: WNL, normocephalic Pulmonary: normal non-labored breathing without wheezing Cardiac: regular HR; without carotid bruits Abdomen:  soft, NT, no masses; aortic pulse is not palpable Skin: without rashes Vascular Exam/Pulses:  Right Left  Radial 2+ (normal) 2+ (normal)  DP 2+ (normal) 2+ (normal)  PT 2+ (normal) 2+ (normal)   Extremities: mild BLE edema; spider veins present bilaterally  Right lateral leg cluster of spider veins    Neurologic: A&O X 3;  moving all extremities  equally Psychiatric:  The pt has Normal affect.   Non-Invasive Vascular Imaging:   Venous duplex on 09/14/2021: Venous Reflux Times  +--------------+---------+------+-----------+------------+--------+  RIGHT         Reflux NoRefluxReflux TimeDiameter cmsComments                          Yes                                   +--------------+---------+------+-----------+------------+--------+  CFV           no                                              +--------------+---------+------+-----------+------------+--------+  FV mid        no                                              +--------------+---------+------+-----------+------------+--------+  Popliteal     no                                              +--------------+---------+------+-----------+------------+--------+  GSV at SFJ              yes    >500 ms      0.57              +--------------+---------+------+-----------+------------+--------+  GSV prox thigh          yes    >500 ms      0.36              +--------------+---------+------+-----------+------------+--------+  GSV mid thigh no                            0.35              +--------------+---------+------+-----------+------------+--------+  GSV dist thighno                            0.27              +--------------+---------+------+-----------+------------+--------+  GSV at knee   no                            0.19              +--------------+---------+------+-----------+------------+--------+  GSV prox calf no                            0.21              +--------------+---------+------+-----------+------------+--------+  SSV Pop Fossa no                            0.27              +--------------+---------+------+-----------+------------+--------+   Summary:  Right:  - No evidence of deep vein thrombosis from the common femoral through the  popliteal veins.  - No evidence  of superficial venous thrombosis.  - The deep venous system is competent.  - The great saphenous vein is not competent (proximal only).  - The small saphenous vein is competent.     Kathleen Hickman is a 66 y.o. female who presents with: continued pain around the spider veins on the right lateral leg.    -pt has easily palpable pedal pulses -pt does not have evidence of DVT.  Pt does have venous reflux in the GSV at the [facility] and proximal thigh GSV.    Otherwise, she does not have any reflux on the right.  -she does have pain in the spider veins on the lateral aspect of the right leg distal to the knee.  Discussed sclerotherapy with her and vein RN discussed with pt as well.  -discussed with pt about wearing knee high 15-20 mmHg compression stockings, which she has been wearing -discussed the importance of leg elevation and how to elevate properly - pt is advised to elevate their legs and a diagram is given to them to demonstrate for pt to lay flat on their back with knees elevated and slightly bent with their feet higher than their knees, which puts their feet higher than their heart for 15 minutes per day.  If pt cannot lay flat, advised to lay as flat as possible.  -pt is advised to continue as much walking as possible and avoid sitting or standing for long periods of time.  -discussed importance of weight loss and exercise and that water aerobics would also be beneficial.  She has lost around 30lbs intentionally  -handout with recommendations given    Leontine Locket, [facility] 09/19/2021 2:34 PM  Clinic MD:  Scot Dock

## 2021-09-20 ENCOUNTER — Encounter (HOSPITAL_COMMUNITY): Payer: PPO

## 2021-09-27 ENCOUNTER — Ambulatory Visit: Payer: PPO | Admitting: Physician Assistant

## 2021-09-27 VITALS — BP 172/79 | HR 77 | Temp 97.5°F | Resp 18 | Ht 59.0 in | Wt 166.2 lb

## 2021-09-27 DIAGNOSIS — I8393 Asymptomatic varicose veins of bilateral lower extremities: Secondary | ICD-10-CM | POA: Diagnosis not present

## 2021-10-04 DIAGNOSIS — H401131 Primary open-angle glaucoma, bilateral, mild stage: Secondary | ICD-10-CM | POA: Diagnosis not present

## 2021-10-19 ENCOUNTER — Ambulatory Visit: Payer: PPO

## 2021-11-09 ENCOUNTER — Ambulatory Visit: Payer: PPO

## 2021-11-09 DIAGNOSIS — M7989 Other specified soft tissue disorders: Secondary | ICD-10-CM

## 2021-11-09 DIAGNOSIS — I8393 Asymptomatic varicose veins of bilateral lower extremities: Secondary | ICD-10-CM | POA: Diagnosis not present

## 2021-11-09 DIAGNOSIS — H401131 Primary open-angle glaucoma, bilateral, mild stage: Secondary | ICD-10-CM | POA: Diagnosis not present

## 2021-11-09 NOTE — Progress Notes (Signed)
Treated pt's BLE spider and reticular veins with Asclear 1% administered with a 27g butterfly.  Patient received a total of 6 mL. Pt tolerated well. She has extensive areas that needed to be treated and will likely need retreatment, as we discussed today. Pt was given post treatment care instructions on both handout and verbal. Will follow prn.   Photos: Yes.    Compression stockings applied: Yes.

## 2021-11-22 DIAGNOSIS — E7800 Pure hypercholesterolemia, unspecified: Secondary | ICD-10-CM | POA: Diagnosis not present

## 2021-11-22 DIAGNOSIS — I129 Hypertensive chronic kidney disease with stage 1 through stage 4 chronic kidney disease, or unspecified chronic kidney disease: Secondary | ICD-10-CM | POA: Diagnosis not present

## 2021-11-22 DIAGNOSIS — N181 Chronic kidney disease, stage 1: Secondary | ICD-10-CM | POA: Diagnosis not present

## 2021-11-22 DIAGNOSIS — Z1211 Encounter for screening for malignant neoplasm of colon: Secondary | ICD-10-CM | POA: Diagnosis not present

## 2021-11-22 DIAGNOSIS — F419 Anxiety disorder, unspecified: Secondary | ICD-10-CM | POA: Diagnosis not present

## 2021-11-22 DIAGNOSIS — E1121 Type 2 diabetes mellitus with diabetic nephropathy: Secondary | ICD-10-CM | POA: Diagnosis not present

## 2021-12-03 DIAGNOSIS — Z1211 Encounter for screening for malignant neoplasm of colon: Secondary | ICD-10-CM | POA: Diagnosis not present

## 2021-12-13 LAB — EXTERNAL GENERIC LAB PROCEDURE: COLOGUARD: NEGATIVE

## 2021-12-13 LAB — COLOGUARD: COLOGUARD: NEGATIVE

## 2021-12-28 DIAGNOSIS — H401131 Primary open-angle glaucoma, bilateral, mild stage: Secondary | ICD-10-CM | POA: Diagnosis not present

## 2022-10-14 ENCOUNTER — Observation Stay
Admission: EM | Admit: 2022-10-14 | Discharge: 2022-10-15 | Disposition: A | Discharge status: Home / Self Care | Payer: PPO | Attending: Student in an Organized Health Care Education/Training Program | Admitting: Student in an Organized Health Care Education/Training Program

## 2022-10-14 ENCOUNTER — Encounter: Payer: Self-pay | Admitting: Family Medicine

## 2022-10-14 ENCOUNTER — Other Ambulatory Visit: Payer: Self-pay

## 2022-10-14 ENCOUNTER — Observation Stay: Payer: PPO

## 2022-10-14 ENCOUNTER — Emergency Department: Payer: PPO

## 2022-10-14 DIAGNOSIS — I10 Essential (primary) hypertension: Secondary | ICD-10-CM | POA: Diagnosis not present

## 2022-10-14 DIAGNOSIS — G459 Transient cerebral ischemic attack, unspecified: Principal | ICD-10-CM | POA: Diagnosis present

## 2022-10-14 DIAGNOSIS — Z7982 Long term (current) use of aspirin: Secondary | ICD-10-CM | POA: Diagnosis not present

## 2022-10-14 DIAGNOSIS — Z7984 Long term (current) use of oral hypoglycemic drugs: Secondary | ICD-10-CM | POA: Diagnosis not present

## 2022-10-14 DIAGNOSIS — E669 Obesity, unspecified: Secondary | ICD-10-CM | POA: Diagnosis not present

## 2022-10-14 DIAGNOSIS — E1165 Type 2 diabetes mellitus with hyperglycemia: Secondary | ICD-10-CM | POA: Insufficient documentation

## 2022-10-14 DIAGNOSIS — Z79899 Other long term (current) drug therapy: Secondary | ICD-10-CM | POA: Insufficient documentation

## 2022-10-14 DIAGNOSIS — R531 Weakness: Secondary | ICD-10-CM | POA: Diagnosis present

## 2022-10-14 LAB — URINALYSIS, ROUTINE W REFLEX MICROSCOPIC
Bacteria, UA: NONE SEEN
Bilirubin Urine: NEGATIVE
Glucose, UA: >=500 mg/dL — AB
Hgb urine dipstick: NEGATIVE
Ketones, ur: NEGATIVE mg/dL
Leukocytes,Ua: NEGATIVE
Nitrite: NEGATIVE
Protein, ur: NEGATIVE mg/dL
Specific Gravity, Urine: 1.018 (ref 1.005–1.030)
pH: 6 (ref 5.0–8.0)

## 2022-10-14 LAB — PROTIME-INR
INR: 1 (ref 0.8–1.2)
Prothrombin Time: 13.8 seconds (ref 11.4–15.2)

## 2022-10-14 LAB — CBC WITH DIFFERENTIAL/PLATELET
Abs Immature Granulocytes: 0.01 10*3/uL (ref 0.00–0.07)
Basophils Absolute: 0 10*3/uL (ref 0.0–0.1)
Basophils Relative: 0 %
Eosinophils Absolute: 0.1 10*3/uL (ref 0.0–0.5)
Eosinophils Relative: 2 %
HCT: 43.3 % (ref 36.0–46.0)
Hemoglobin: 14.9 g/dL (ref 12.0–15.0)
Immature Granulocytes: 0 %
Lymphocytes Relative: 26 %
Lymphs Abs: 1.7 10*3/uL (ref 0.7–4.0)
MCH: 31.4 pg (ref 26.0–34.0)
MCHC: 34.4 g/dL (ref 30.0–36.0)
MCV: 91.4 fL (ref 80.0–100.0)
Monocytes Absolute: 0.4 10*3/uL (ref 0.1–1.0)
Monocytes Relative: 7 %
Neutro Abs: 4.1 10*3/uL (ref 1.7–7.7)
Neutrophils Relative %: 65 %
Platelets: 290 10*3/uL (ref 150–400)
RBC: 4.74 MIL/uL (ref 3.87–5.11)
RDW: 12.2 % (ref 11.5–15.5)
WBC: 6.3 10*3/uL (ref 4.0–10.5)
nRBC: 0 % (ref 0.0–0.2)

## 2022-10-14 LAB — COMPREHENSIVE METABOLIC PANEL
ALT: 29 U/L (ref 0–44)
AST: 31 U/L (ref 15–41)
Albumin: 4.3 g/dL (ref 3.5–5.0)
Alkaline Phosphatase: 69 U/L (ref 38–126)
Anion gap: 11 (ref 5–15)
BUN: 12 mg/dL (ref 8–23)
CO2: 21 mmol/L — ABNORMAL LOW (ref 22–32)
Calcium: 9.4 mg/dL (ref 8.9–10.3)
Chloride: 104 mmol/L (ref 98–111)
Creatinine, Ser: 0.67 mg/dL (ref 0.44–1.00)
GFR, Estimated: >60 mL/min (ref >60)
Glucose, Bld: 297 mg/dL — ABNORMAL HIGH (ref 70–99)
Potassium: 3.7 mmol/L (ref 3.5–5.1)
Sodium: 136 mmol/L (ref 135–145)
Total Bilirubin: 0.8 mg/dL (ref 0.3–1.2)
Total Protein: 7.6 g/dL (ref 6.5–8.1)

## 2022-10-14 LAB — CBC
HCT: 42.7 % (ref 36.0–46.0)
Hemoglobin: 14.7 g/dL (ref 12.0–15.0)
MCH: 31.3 pg (ref 26.0–34.0)
MCHC: 34.4 g/dL (ref 30.0–36.0)
MCV: 91 fL (ref 80.0–100.0)
Platelets: 279 10*3/uL (ref 150–400)
RBC: 4.69 MIL/uL (ref 3.87–5.11)
RDW: 12.2 % (ref 11.5–15.5)
WBC: 6.3 10*3/uL (ref 4.0–10.5)
nRBC: 0 % (ref 0.0–0.2)

## 2022-10-14 LAB — HEMOGLOBIN A1C
Hgb A1c MFr Bld: 9.9 % — ABNORMAL HIGH (ref 4.8–5.6)
Mean Plasma Glucose: 237.43 mg/dL

## 2022-10-14 LAB — CBG MONITORING, ED
Glucose-Capillary: 213 mg/dL — ABNORMAL HIGH (ref 70–99)
Glucose-Capillary: 224 mg/dL — ABNORMAL HIGH (ref 70–99)

## 2022-10-14 LAB — LIPID PANEL
Cholesterol: 204 mg/dL — ABNORMAL HIGH (ref 0–200)
HDL: 51 mg/dL (ref >40)
LDL Cholesterol: 131 mg/dL — ABNORMAL HIGH (ref 0–99)
Total CHOL/HDL Ratio: 4 RATIO
Triglycerides: 112 mg/dL (ref <150)
VLDL: 22 mg/dL (ref 0–40)

## 2022-10-14 LAB — APTT: aPTT: 29 seconds (ref 24–36)

## 2022-10-14 LAB — ETHANOL: Alcohol, Ethyl (B): <10 mg/dL (ref <10)

## 2022-10-14 LAB — GLUCOSE, CAPILLARY: Glucose-Capillary: 218 mg/dL — ABNORMAL HIGH (ref 70–99)

## 2022-10-14 MED ORDER — ACETAMINOPHEN 325 MG PO TABS: 650.0000 mg | ORAL_TABLET | Freq: Four times a day (QID) | ORAL | Status: DC | PRN

## 2022-10-14 MED ORDER — ASPIRIN 325 MG PO TABS
325.0000 mg | ORAL_TABLET | Freq: Every day | ORAL | Status: DC
  Administered 2022-10-14: 325 mg via ORAL
  Filled 2022-10-14 (×2): qty 1

## 2022-10-14 MED ORDER — ASPIRIN 300 MG RE SUPP
300.0000 mg | Freq: Every day | RECTAL | Status: DC
  Filled 2022-10-14: qty 1

## 2022-10-14 MED ORDER — STROKE: EARLY STAGES OF RECOVERY BOOK: Freq: Once | Status: AC

## 2022-10-14 MED ORDER — ACETAMINOPHEN 650 MG RE SUPP: 650.0000 mg | Freq: Four times a day (QID) | RECTAL | Status: DC | PRN

## 2022-10-14 MED ORDER — SODIUM CHLORIDE 0.9% FLUSH
3.0000 mL | Freq: Once | INTRAVENOUS | Status: AC
  Administered 2022-10-14: 3 mL via INTRAVENOUS

## 2022-10-14 MED ORDER — IOHEXOL 350 MG/ML SOLN
75.0000 mL | Freq: Once | INTRAVENOUS | Status: AC | PRN
  Administered 2022-10-14: 75 mL via INTRAVENOUS

## 2022-10-14 MED ORDER — ONDANSETRON HCL 4 MG/2ML IJ SOLN: 4.0000 mg | Freq: Four times a day (QID) | INTRAMUSCULAR | Status: DC | PRN

## 2022-10-14 MED ORDER — INSULIN ASPART 100 UNIT/ML IJ SOLN
0.0000 [IU] | Freq: Every day | INTRAMUSCULAR | Status: DC
  Administered 2022-10-14: 2 [IU] via SUBCUTANEOUS
  Filled 2022-10-14: qty 1

## 2022-10-14 MED ORDER — ENOXAPARIN SODIUM 40 MG/0.4ML IJ SOSY
40.0000 mg | PREFILLED_SYRINGE | INTRAMUSCULAR | Status: DC
  Administered 2022-10-14: 40 mg via SUBCUTANEOUS
  Filled 2022-10-14: qty 0.4

## 2022-10-14 MED ORDER — INSULIN ASPART 100 UNIT/ML IJ SOLN
0.0000 [IU] | Freq: Three times a day (TID) | INTRAMUSCULAR | Status: DC
  Administered 2022-10-14 – 2022-10-15 (×3): 5 [IU] via SUBCUTANEOUS
  Filled 2022-10-14 (×3): qty 1

## 2022-10-14 MED ORDER — ONDANSETRON HCL 4 MG PO TABS: 4.0000 mg | ORAL_TABLET | Freq: Four times a day (QID) | ORAL | Status: DC | PRN

## 2022-10-14 NOTE — Assessment & Plan Note (Signed)
BMI 35 

## 2022-10-14 NOTE — Assessment & Plan Note (Addendum)
Used to be on metformin, doesn't go to the doctor now, doesn't take anything now.  Hyperglycemic here. - Check A1c - SS corrections

## 2022-10-14 NOTE — ED Triage Notes (Signed)
Pt comes with c/o weakness to right arm that started yesterday. Pt states she stood up and passed out . Pt lost bowels. Pt states she came to and has just been out of it since. Pt states little foggy headed.  192/88

## 2022-10-14 NOTE — ED Notes (Signed)
Pt ambulated from wheelchair to bed and tolerated well. States she "feels a lot better, like I just woke up". Denies pain. Resting comfortably in bed

## 2022-10-14 NOTE — Assessment & Plan Note (Signed)
-   Start aspirin - Obtain MRI - Obtain CT angiogram of the head and neck - Obtain echocardiogram - Frequent neurochecks - Swallow screen - Check lipids and hemoglobin A1c - Allow permissive hypertension for now, if MRI negative will start BP meds - Consult Neuro, appreciate cares

## 2022-10-14 NOTE — Assessment & Plan Note (Signed)
Used to be on ACE and BB.  No longer on meds. - Permissive for now, will start pending Neuro recs/MRI

## 2022-10-14 NOTE — ED Notes (Signed)
   10/14/22 1148  Orthostatic Lying   BP- Lying (!) 201/84  Pulse- Lying 79  Orthostatic Sitting  BP- Sitting 196/89  Pulse- Sitting 83  Orthostatic Standing at 0 minutes  BP- Standing at 0 minutes (!) 188/98  Pulse- Standing at 0 minutes 83   Pt denies dizziness and lightheadedness with standing.

## 2022-10-14 NOTE — ED Provider Notes (Signed)
[facility] Provider Note    Event Date/Time   First MD Initiated Contact with Patient 10/14/22 1053     (approximate)   History   Weakness   HPI  Kathleen Hickman is a 67 y.o. female with a history of diabetes, hypertension, and hyperlipidemia who presents with an episode of right-sided weakness yesterday.  The patient states that she was sitting in a chair when she tried to grab the remote control and noted that her right arm was weak.  She then subsequently tried to get up and her right leg gave out, causing her to fall.  She did not hit her head or lose consciousness.  She states that the weakness and numbness lasted about 10 minutes and then resolved.  She also had an episode of bowel incontinence around the time she fell, but since then has had normal control of her bowel movements.  She denies any urinary incontinence.  The patient states that she felt some brain fog since this episode until earlier this morning, but now denies any acute symptoms.  She denies any prior history of episodes like this.  I reviewed the past medical records.  The patient's most recent outpatient encounter here was with vascular surgery in June of last year for lower extremity telangiectasias.  She has no recent ED visits or hospitalizations.   Physical Exam   Triage Vital Signs: ED Triage Vitals [10/14/22 0957]  Enc Vitals Group     BP (!) 202/95     Pulse Rate 88     Resp 20     Temp 98.3 F (36.8 C)     Temp Source Oral     SpO2 95 %     Weight      Height      Head Circumference      Peak Flow      Pain Score      Pain Loc      Pain Edu?      Excl. in GC?     Most recent vital signs: Vitals:   10/14/22 1450 10/14/22 1600  BP: (!) 174/80 (!) 165/84  Pulse: 88 87  Resp: 18 16  Temp:    SpO2: 98% 97%     General: Alert and oriented, well-appearing, no distress.  CV:  Good peripheral perfusion.   Resp:  Normal effort.   Abd:  No distention.   Other:  EOMI.  PERRLA.  No facial droop.  Cranial nerves III through XII grossly intact.  5/5 motor strength and intact sensation of bilateral upper and lower extremities.  No pronator drift.  No ataxia.   ED Results / Procedures / Treatments   Labs (all labs ordered are listed, but only abnormal results are displayed) Labs Reviewed  COMPREHENSIVE METABOLIC PANEL - Abnormal; Notable for the following components:      Result Value   CO2 21 (*)    Glucose, Bld 297 (*)    All other components within normal limits  URINALYSIS, ROUTINE W REFLEX MICROSCOPIC - Abnormal; Notable for the following components:   Color, Urine YELLOW (*)    APPearance CLEAR (*)    Glucose, UA >=500 (*)    All other components within normal limits  CBG MONITORING, ED - Abnormal; Notable for the following components:   Glucose-Capillary 213 (*)    All other components within normal limits  CBG MONITORING, ED - Abnormal; Notable for the following components:   Glucose-Capillary 224 (*)  All other components within normal limits  PROTIME-INR  APTT  CBC  CBC WITH DIFFERENTIAL/PLATELET  ETHANOL  HEMOGLOBIN A1C  LIPID PANEL  I-STAT CREATININE, ED     EKG  ED ECG REPORT I, Dionne Bucy, the attending physician, personally viewed and interpreted this ECG.  Date: 10/14/2022 EKG Time:  Rate:  Rhythm: normal sinus rhythm QRS Axis: normal Intervals: normal ST/T Wave abnormalities: normal Narrative Interpretation: no evidence of acute ischemia    RADIOLOGY  CT head: I independently viewed and interpreted the images; there is no ICH.  Radiology report indicates no acute abnormality.   PROCEDURES:  Critical Care performed: No  Procedures   MEDICATIONS ORDERED IN ED: Medications  aspirin suppository 300 mg ( Rectal See Alternative 10/14/22 1330)    Or  aspirin tablet 325 mg (325 mg Oral Given 10/14/22 1330)  insulin aspart (novoLOG) injection 0-15 Units (5 Units Subcutaneous Given  10/14/22 1342)  insulin aspart (novoLOG) injection 0-5 Units (has no administration in time range)  sodium chloride flush (NS) 0.9 % injection 3 mL (3 mLs Intravenous Given 10/14/22 1312)  iohexol (OMNIPAQUE) 350 MG/ML injection 75 mL (75 mLs Intravenous Contrast Given 10/14/22 1515)     IMPRESSION / MDM / ASSESSMENT AND PLAN / ED COURSE  I reviewed the triage vital signs and the nursing notes.  67 year old female with PMH as noted above presents with acute onset of right-sided weakness yesterday which lasted about 10 minutes and has now resolved.  Currently on exam her vital signs are normal except for hypertension.  Thorough neurologic exam is nonfocal.  Differential diagnosis includes, but is not limited to, TIA, CVA, complex migraine, less likely radiculopathy.  There is no evidence of cauda equina syndrome given the resolved symptoms and normal exam.  CT head is negative.  Will obtain lab workup, MRI, and consult neurology.  Patient's presentation is most consistent with acute presentation with potential threat to life or bodily function.  The patient is on the cardiac monitor to evaluate for evidence of arrhythmia and/or significant heart rate changes.  ----------------------------------------- 12:35 PM on 10/14/2022 -----------------------------------------  Labs are unremarkable.  There is no leukocytosis or anemia.  Electrolytes are normal.  I consulted and discussed the case with Dr. Wilford Corner from neurology who recommends inpatient admission for a stroke workup.  I then consulted Dr. Maryfrances Bunnell from the hospitalist service; based on our discussion he agrees to evaluate the patient for admission.  FINAL CLINICAL IMPRESSION(S) / ED DIAGNOSES   Final diagnoses:  Right sided weakness     Rx / DC Orders   ED Discharge Orders     None        Note:  This document was prepared using Dragon voice recognition software and may include unintentional dictation errors.    Dionne Bucy, MD 10/14/22 1627

## 2022-10-14 NOTE — Hospital Course (Addendum)
Kathleen Hickman is a 67 y.o. F with obesity, DM who presented with acute right sided weakness.  Was watching TV, had a "weird" feeling come over her, then felt right arm heaviness.  Stood up and fell down due to leg weakness (without losing consciousness), then stood up but right arm/leg still didn't work.  Symptoms resolved in the shower.  Today went to [facility] and was sent to the ER. In the ER, CTH normal. Electrolytes, renal function and CBC normal.  Admitted for TIA work up.  Neuro consulted.  Orthostatics negative.

## 2022-10-14 NOTE — H&P (Signed)
History and Physical    Patient: Kathleen Hickman:[id number] DOB: [birth year] DOA: 10/14/2022 DOS: the patient was seen and examined on 10/14/2022 PCP: Maurice Small, MD (Inactive)  Patient coming from: Home  Chief Complaint:  Chief Complaint  Patient presents with   Weakness       HPI:  Kathleen Hickman is a 67 y.o. F with obesity, DM who presented with acute right sided weakness.  Was watching TV, had a "weird" feeling come over her, then felt right arm heaviness.  Stood up and fell down due to leg weakness (without losing consciousness), then stood up but right arm/leg still didn't work.  Symptoms resolved in the shower.  Today went to [facility] and was sent to the ER. In the ER, CTH normal. Electrolytes, renal function and CBC normal.  Admitted for TIA work up.  Neuro consulted.  Orthostatics negative.      Review of Systems  Constitutional:  Negative for chills, fever and malaise/fatigue.  Neurological:  Positive for focal weakness. Negative for dizziness, sensory change, speech change, seizures, loss of consciousness and weakness.  All other systems reviewed and are negative.    Past Medical History:  Diagnosis Date   Diabetes mellitus without complication (HCC)    Hyperlipidemia    Hypertension    Past Surgical History:  Procedure Laterality Date   CARPAL TUNNEL WITH CUBITAL TUNNEL Right    CHOLECYSTECTOMY     HAND SURGERY Right    fracture repair in 1990   SHOULDER SURGERY Right    SPINE SURGERY     TUBAL LIGATION     Social History: Works as a Nurse, adult, lives alone, never smoker.    Allergies  Allergen Reactions   Morphine And Codeine Anaphylaxis   Aspirin Nausea Only    Pt states can take enteric coated low-dose aspirin.   Ciprofloxacin Hives   Codeine Other (See Comments)    Thrush in the mouth   Elemental Sulfur Hives   Influenza Vaccines     Knot at injection site    Family History  Problem Relation Age of Onset   Dementia Mother     Heart disease Father    Diabetes Father    Breast cancer Neg Hx     Prior to Admission medications   Medication Sig Start Date End Date Taking? Authorizing Provider  aspirin EC 81 MG tablet Take 81 mg by mouth daily.    [provider]  benazepril (LOTENSIN) 10 MG tablet Take 10 mg by mouth daily.    [provider]  gabapentin (NEURONTIN) 300 MG capsule Take 300 mg by mouth 3 (three) times daily.    [provider]  latanoprost (XALATAN) 0.005 % ophthalmic solution 1 drop at bedtime.    [provider]  metFORMIN (GLUCOPHAGE) 500 MG tablet Take by mouth 2 (two) times daily with a meal.    [provider]  metoprolol succinate (TOPROL-XL) 25 MG 24 hr tablet Take 25 mg by mouth daily.    [provider]  Multiple Vitamins-Minerals (MULTI FOR HER PO) Take by mouth.    [provider]  nabumetone (RELAFEN) 750 MG tablet Take 750 mg by mouth daily.    [provider]  nitrofurantoin, macrocrystal-monohydrate, (MACROBID) 100 MG capsule Take 100 mg by mouth at bedtime. 10/25/20   [provider]  sertraline (ZOLOFT) 50 MG tablet Take 50 mg by mouth daily.    [provider]  traMADol (ULTRAM) 50 MG tablet Take by  mouth every 6 (six) hours as needed.    [provider]  zolpidem (AMBIEN) 10 MG tablet Take 10 mg by mouth at bedtime as needed for sleep.    [provider]    Physical Exam: Vitals:   10/14/22 0957 10/14/22 1100  BP: (!) 202/95 (!) 186/80  Pulse: 88 91  Resp: 20 16  Temp: 98.3 F (36.8 C)   TempSrc: Oral   SpO2: 95% 96%   General appearance: Alert adult female, lying in bed, no acute distress, responds appropriate to questions, eye contact, dress and hygiene appropriate  HEENT: Anicteric, conjunctival pink, lids and lashes normal.  No deformity, discharge, or epistaxis, oropharynx moist without lesions, dentures in place, lips normal, normal auditory acuity. Cor:  RRR,  without murmurs, rubs.  JVP normal, no LE edema Resp:  Normal respiratory rate and rhythm.  CTAB without rales or wheezes. Abd:  No TTP or rebound all quadrants.  No masses or organomegaly.   No ascites, distension. MSK: Symmetrical without gross deformities of the hands, large joints, or legs. Skin:  cap refill normal, Skin intact without significant rashes or lesions. Neuro: Extraocular movements grossly intact, no nystagmus, cranial nerve V within normal limits, cranial nerves symmetric, cranial nerve VIII sounds normal, cranial nerves IX and X reveal equal palate elevation, cranial nerve XI revealed sternocleidomastoid strong, cranial nerve XII midline.  Motor testing seems 5 -/5 in the right upper extremity, normal on the left, 5/5 in the right lower extremity.  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  Sensory examination is intact to light touch, finger-nose testing normal, patient is oriented to person, place, and time, speech fluent, naming grossly intact, recent and remote recall seems normal, attention span and concentration seem normal.   Psych:  Affect normal.  appropriate thought content and normal rate of speech, thought process linear           Data Reviewed: Basic metabolic panel shows hyperglycemia, normal renal function electrolytes CBC unremarkable INR and PTT normal Urinalysis shows glucosuria CT head negative EKG, personally reviewed, shows normal sinus rhythm    Assessment and Plan: * TIA (transient ischemic attack) - Start aspirin - Obtain MRI - Obtain CT angiogram of the head and neck - Obtain echocardiogram - Frequent neurochecks - Swallow screen - Check lipids and hemoglobin A1c - Allow permissive hypertension for now, if MRI negative will start BP meds - Consult Neuro, appreciate cares    Essential hypertension Used to be on ACE and BB.  No longer on meds. - Permissive for now, will start pending Neuro recs/MRI  Uncontrolled type  2 diabetes mellitus with hyperglycemia, without long-term current use of insulin (HCC) Used to be on metformin, doesn't go to the doctor now, doesn't take anything now.  Hyperglycemic here. - Check A1c - SS corrections  Obesity (BMI 30-39.9) BMI 35         Advance Care Planning: FULL  Consults: Neurology, Dr. Wilford Corner  Family Communication: Daughter  Severity of Illness: The appropriate patient status for this patient is OBSERVATION. Observation status is judged to be reasonable and necessary in order to provide the required intensity of service to ensure the patient's safety. The patient's presenting symptoms, physical exam findings, and initial radiographic and laboratory data in the context of their medical condition is felt to place them at decreased risk for further clinical deterioration. Furthermore, it is anticipated that the patient will be medically stable for discharge from the hospital within 2 midnights  of admission.   Author: Alberteen Sam, MD 10/14/2022 1:59 PM  For on call review [email or web address].

## 2022-10-14 NOTE — ED Notes (Signed)
Pt ambulatory to bathroom independently with steady gait.

## 2022-10-15 ENCOUNTER — Observation Stay (HOSPITAL_BASED_OUTPATIENT_CLINIC_OR_DEPARTMENT_OTHER)
Admit: 2022-10-15 | Discharge: 2022-10-15 | Disposition: A | Discharge status: Home / Self Care | Payer: PPO | Attending: Family Medicine | Admitting: Family Medicine

## 2022-10-15 DIAGNOSIS — G459 Transient cerebral ischemic attack, unspecified: Secondary | ICD-10-CM

## 2022-10-15 LAB — ECHOCARDIOGRAM COMPLETE
AR max vel: 2.05 cm2
AV Area VTI: 2.47 cm2
AV Area mean vel: 2.04 cm2
AV Mean grad: 3 mmHg
AV Peak grad: 6.3 mmHg
Ao pk vel: 1.25 m/s
Area-P 1/2: 2.85 cm2
MV VTI: 2.15 cm2
S' Lateral: 2.7 cm

## 2022-10-15 LAB — LIPID PANEL
Cholesterol: 211 mg/dL — ABNORMAL HIGH (ref 0–200)
HDL: 48 mg/dL (ref >40)
LDL Cholesterol: 134 mg/dL — ABNORMAL HIGH (ref 0–99)
Total CHOL/HDL Ratio: 4.4 RATIO
Triglycerides: 147 mg/dL (ref <150)
VLDL: 29 mg/dL (ref 0–40)

## 2022-10-15 LAB — HIV ANTIBODY (ROUTINE TESTING W REFLEX): HIV Screen 4th Generation wRfx: NONREACTIVE

## 2022-10-15 LAB — GLUCOSE, CAPILLARY: Glucose-Capillary: 238 mg/dL — ABNORMAL HIGH (ref 70–99)

## 2022-10-15 MED ORDER — LIVING WELL WITH DIABETES BOOK
Freq: Once | Status: AC
  Filled 2022-10-15: qty 1

## 2022-10-15 MED ORDER — ATORVASTATIN CALCIUM 80 MG PO TABS: 80.0000 mg | ORAL_TABLET | Freq: Every day | ORAL | 0 refills | Status: DC

## 2022-10-15 MED ORDER — METOPROLOL SUCCINATE ER 25 MG PO TB24
25.0000 mg | ORAL_TABLET | Freq: Every day | ORAL | Status: DC
  Administered 2022-10-15: 25 mg via ORAL
  Filled 2022-10-15: qty 1

## 2022-10-15 MED ORDER — ASPIRIN 81 MG PO TBEC
81.0000 mg | DELAYED_RELEASE_TABLET | Freq: Every day | ORAL | Status: DC
  Administered 2022-10-15: 81 mg via ORAL
  Filled 2022-10-15: qty 1

## 2022-10-15 MED ORDER — ASPIRIN 81 MG PO TBEC: 162.0000 mg | DELAYED_RELEASE_TABLET | Freq: Once | ORAL | Status: DC

## 2022-10-15 MED ORDER — CLOPIDOGREL BISULFATE 75 MG PO TABS: 75.0000 mg | ORAL_TABLET | Freq: Every day | ORAL | 0 refills | Status: DC

## 2022-10-15 MED ORDER — GLIPIZIDE ER 2.5 MG PO TB24
2.5000 mg | ORAL_TABLET | Freq: Every day | ORAL | Status: DC
  Administered 2022-10-15: 2.5 mg via ORAL
  Filled 2022-10-15: qty 1

## 2022-10-15 MED ORDER — ASPIRIN 325 MG PO TBEC: 325.0000 mg | DELAYED_RELEASE_TABLET | Freq: Every day | ORAL | 0 refills | Status: AC

## 2022-10-15 MED ORDER — GLIPIZIDE ER 2.5 MG PO TB24: 2.5000 mg | ORAL_TABLET | Freq: Every day | ORAL | 0 refills | Status: DC

## 2022-10-15 MED ORDER — BENAZEPRIL HCL 10 MG PO TABS
10.0000 mg | ORAL_TABLET | Freq: Every day | ORAL | Status: DC
  Administered 2022-10-15: 10 mg via ORAL
  Filled 2022-10-15: qty 1

## 2022-10-15 MED ORDER — ATORVASTATIN CALCIUM 20 MG PO TABS
40.0000 mg | ORAL_TABLET | Freq: Every day | ORAL | Status: DC
  Administered 2022-10-15: 40 mg via ORAL
  Filled 2022-10-15: qty 2

## 2022-10-15 MED ORDER — METFORMIN HCL 500 MG PO TABS: 500.0000 mg | ORAL_TABLET | Freq: Every day | ORAL | Status: DC

## 2022-10-15 MED ORDER — METFORMIN HCL 500 MG PO TABS: ORAL_TABLET | ORAL | 0 refills | Status: DC

## 2022-10-15 MED ORDER — METOPROLOL SUCCINATE ER 25 MG PO TB24: 25.0000 mg | ORAL_TABLET | Freq: Every day | ORAL | 0 refills | Status: DC

## 2022-10-15 MED ORDER — ASPIRIN 325 MG PO TABS: 325.0000 mg | ORAL_TABLET | Freq: Every day | ORAL | Status: DC

## 2022-10-15 MED ORDER — CLOPIDOGREL BISULFATE 75 MG PO TABS
75.0000 mg | ORAL_TABLET | Freq: Every day | ORAL | Status: DC
  Administered 2022-10-15: 75 mg via ORAL
  Filled 2022-10-15: qty 1

## 2022-10-15 NOTE — Inpatient Diabetes Management (Addendum)
Inpatient Diabetes Program Recommendations  AACE/ADA: New Consensus Statement on Inpatient Glycemic Control   Target Ranges:  Prepandial:   less than 140 mg/dL      Peak postprandial:   less than 180 mg/dL (1-2 hours)      Critically ill patients:  140 - 180 mg/dL    Latest Reference Range & Units 10/14/22 13:37 10/14/22 16:22 10/14/22 21:19  Glucose-Capillary 70 - 99 mg/dL 948 (H) 546 (H) 270 (H)    Latest Reference Range & Units 10/14/22 09:47  Hemoglobin A1C 4.8 - 5.6 % 9.9 (H)   Review of Glycemic Control  Diabetes history: DM2 Outpatient Diabetes medications: Metformin 500 mg BID (not taking) Current orders for Inpatient glycemic control: Novolog 0-15 units TID with meals, Novolog 0-5 units QHS  Inpatient Diabetes Program Recommendations:    Oral DM medication: May want to consider ordering Metformin 500 mg BID and Glipizide 2.5 mg daily as inpatient to determine impact on glycemic control.  HbgA1C:  A1C 9.9% on 10/14/22 indicating an average glucose of 237 mg/dl over the past 2-3 months.  Patient will need DM medication prescribed at discharge.   NOTE: Patient admitted with TIA, hypertension, and uncontrolled DM. Per H&P, patient use to take Metformin and doesn't go to the doctor; so currently taking anything for DM. Ordered Living Well with DM book. Will plan to speak with patient today.  Addendum 10/15/22@13 :30-Went by to talk with patient about DM control. Patient already discharged. Called patient's cell phone number [phone]) and had to leave a message; requested patient return call to discuss DM control, DM medications, and follow up.   Thanks, Orlando Penner, RN, MSN, CDCES Diabetes Coordinator Inpatient Diabetes Program [phone] (Team Pager from 8am to 5pm)

## 2022-10-15 NOTE — Discharge Summary (Signed)
Physician Discharge Summary  Patient: CALIE SIEGEL ZOX:[id number] DOB: February 09, 1956   Code Status: Full Code Admit date: 10/14/2022 Discharge date: 10/15/2022 Disposition: Home, No home health services recommended PCP: Maurice Small, MD (Inactive)  Recommendations for Outpatient Follow-up:  Follow up with PCP within 1-2 weeks Regarding general hospital follow up and preventative care Blood pressure monitoring and management. Per neurology- avoid hypotension. Recommended goal BP around 140/90 Diabetes monitoring and medication adjustments Follow up with neurology 8-12 weeks Regarding stroke follow up and prevention Consider vascular surgery consult  Discharge Diagnoses:  Principal Problem:   TIA (transient ischemic attack) Active Problems:   Obesity (BMI 30-39.9)   Uncontrolled type 2 diabetes mellitus with hyperglycemia, without long-term current use of insulin [facility])   Essential hypertension  Brief Hospital Course Summary: ABYGAYLE WUEBBEN is a 67 y.o. female with a PMH significant for type II DM, HLD, HTN.   They presented from home to the ED on 10/14/2022 with right sided weakness that occurred a couple days prior to presentation to the ED. She was watching TV when it occurred and lasted for several hours. She felt as though her right arm and leg were dead. She presented to the UC with her daughters insistence who then directed her to the ED.    In the ED, it was found that they had stable vital signs with the exception of severely elevated blood pressures with iniital systolic of 202/95.  Significant findings included unremarkable CBC, metabolic panel, and negative orthostatic vitals. CT head was negative acute findings.  Brain MRI also negative for acute abnormality but showing signs of likely chronic small vessel ischemic disease.  CTA head and neck showed 1. 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 More distal M2 MCA branch remains patent. 2. Severe stenosis involving the bilateral P1 and P2 PCAs. 3. Moderate right paraclinoid ICA stenosis.    They were initially treated with aspirin, insulin. Neurology was consulted.     Patient was admitted to medicine service for further workup and management of TIA and hypertensive urgency as outlined in detail below.   10/15/22 -She had resolution of all symptoms prior to admission and completed further workup of her TIA including echo which was unremarkable. She was allowed permissive hypertension for the first 24 hours and then gradually restarted home blood pressure medications. She was started on plavix and aspirin as well as statin therapy. PT/OT evaluated and did not recommend further therapy needed.   hgb A1c was 9.9. patient received diabetes education and started on metformin and glipizide.   Discharge Condition: Good, improved Recommended discharge diet:  mediterranean diet  Consultations: Neurology   Procedures/Studies: Echo   Allergies as of 10/15/2022       Reactions   Morphine And Codeine Anaphylaxis   Aspirin Nausea Only   Pt states can take enteric coated low-dose aspirin.   Ciprofloxacin Hives   Codeine Other (See Comments)   Thrush in the mouth   Elemental Sulfur Hives   Influenza Vaccines    Knot at injection site   Penicillin G Rash        Medication List     STOP taking these medications    benazepril 10 MG tablet Commonly known as: LOTENSIN   gabapentin 300 MG capsule Commonly known as: NEURONTIN   latanoprost 0.005 % ophthalmic solution Commonly known as: XALATAN   MULTI FOR HER PO  nabumetone 750 MG tablet Commonly known as: RELAFEN   nitrofurantoin (macrocrystal-monohydrate) 100 MG capsule Commonly known as: MACROBID   sertraline 50 MG tablet Commonly known as: ZOLOFT   traMADol 50 MG tablet Commonly known as: ULTRAM   zolpidem 10 MG tablet Commonly known as: AMBIEN        TAKE these medications    aspirin EC 325 MG tablet Take 1 tablet (325 mg total) by mouth daily. Swallow whole. Start taking on: October 16, 2022 What changed:  medication strength how much to take additional instructions   atorvastatin 80 MG tablet Commonly known as: LIPITOR Take 1 tablet (80 mg total) by mouth daily.   clopidogrel 75 MG tablet Commonly known as: Plavix Take 1 tablet (75 mg total) by mouth daily.   glipiZIDE 2.5 MG 24 hr tablet Commonly known as: GLUCOTROL XL Take 1 tablet (2.5 mg total) by mouth daily with breakfast. Start taking on: October 16, 2022   metFORMIN 500 MG tablet Commonly known as: GLUCOPHAGE Take 1 tablet (500 mg total) by mouth daily with breakfast for 14 days, THEN 1 tablet (500 mg total) 2 (two) times daily with a meal. Start taking on: October 17, 2022 What changed:  See the new instructions. These instructions start on October 17, 2022. If you are unsure what to do until then, ask your doctor or other care provider.   metoprolol succinate 25 MG 24 hr tablet Commonly known as: TOPROL-XL Take 1 tablet (25 mg total) by mouth daily. Start taking on: October 16, 2022        Follow-up Information     [facility]. Call today.   Why: make a follow up appointment within 2 weeks Contact information: [address] [place] [address] [phone]        Maurice Small, MD. Schedule an appointment as soon as possible for a visit in 1 week(s).   Specialty: Family Medicine Contact information: [address] [place] [address] [phone]                 Subjective   Pt reports feeling well. She denies any further weakness. Denies speech abnormalities. We discuss in great detail diet and exercise as well as new diabetes and stroke medications. She expresses understanding and agreement to follow up with a new PCP.   All questions and concerns were addressed at time  of discharge.  Objective  Blood pressure (!) 165/73, pulse 83, temperature 98.8 F (37.1 C), resp. rate 16, SpO2 96 %.   General: Pt is alert, awake, not in acute distress Cardiovascular: RRR, S1/S2 +, no rubs, no gallops Respiratory: CTA bilaterally, no wheezing, no rhonchi Abdominal: Soft, NT, ND, bowel sounds + Extremities: no edema, no cyanosis  The results of significant diagnostics from this hospitalization (including imaging, microbiology, ancillary and laboratory) are listed below for reference.   Imaging studies: ECHOCARDIOGRAM COMPLETE  Result Date: 10/15/2022    ECHOCARDIOGRAM REPORT   Patient Name:   ZALMA MCCLANAHAN Date of Exam: 10/15/2022 Medical Rec #:  [id number]        Height:       59.0 in Accession #:    [id number]       Weight:       166.2 lb Date of Birth:  Apr 26, 1955        BSA:          1.705 m Patient Age:    67 years  BP:           165/94 mmHg Patient Gender: F                HR:           81 bpm. Exam Location:  [facility] Procedure: 2D Echo, Cardiac Doppler and Color Doppler Indications:     TIA G45.9  History:         Patient has no prior history of Echocardiogram examinations.                  Risk Factors:Diabetes, Hypertension and Dyslipidemia.  Sonographer:     Cristela Blue Referring Phys:  [id number] CHRISTOPHER P DANFORD Diagnosing Phys: Yvonne Kendall MD IMPRESSIONS  1. Left ventricular ejection fraction, by estimation, is 60 to 65%. The left ventricle has normal function. The left ventricle has no regional wall motion abnormalities. There is mild left ventricular hypertrophy. Left ventricular diastolic parameters are consistent with Grade I diastolic dysfunction (impaired relaxation). Elevated left atrial pressure.  2. Right ventricular systolic function is normal. The right ventricular size is normal. Tricuspid regurgitation signal is inadequate for assessing PA pressure.  3. The mitral valve is degenerative. Mild mitral valve regurgitation. No evidence of mitral  stenosis.  4. The aortic valve is tricuspid. Aortic valve regurgitation is trivial. No aortic stenosis is present.  5. The inferior vena cava is normal in size with greater than 50% respiratory variability, suggesting right atrial pressure of 3 mmHg. FINDINGS  Left Ventricle: Left ventricular ejection fraction, by estimation, is 60 to 65%. The left ventricle has normal function. The left ventricle has no regional wall motion abnormalities. The left ventricular internal cavity size was normal in size. There is  mild left ventricular hypertrophy. Left ventricular diastolic parameters are consistent with Grade I diastolic dysfunction (impaired relaxation). Elevated left atrial pressure. Right Ventricle: The right ventricular size is normal. No increase in right ventricular wall thickness. Right ventricular systolic function is normal. Tricuspid regurgitation signal is inadequate for assessing PA pressure. Left Atrium: Left atrial size was normal in size. Right Atrium: Right atrial size was normal in size. Pericardium: Trivial pericardial effusion is present. Mitral Valve: The mitral valve is degenerative in appearance. There is mild thickening of the mitral valve leaflet(s). Mild mitral valve regurgitation. No evidence of mitral valve stenosis. MV peak gradient, 6.2 mmHg. The mean mitral valve gradient is 2.0 mmHg. Tricuspid Valve: The tricuspid valve is normal in structure. Tricuspid valve regurgitation is trivial. Aortic Valve: The aortic valve is tricuspid. Aortic valve regurgitation is trivial. No aortic stenosis is present. Aortic valve mean gradient measures 3.0 mmHg. Aortic valve peak gradient measures 6.2 mmHg. Aortic valve area, by VTI measures 2.47 cm. Pulmonic Valve: The pulmonic valve was not well visualized. Pulmonic valve regurgitation is not visualized. No evidence of pulmonic stenosis. Aorta: The aortic root is normal in size and structure. Pulmonary Artery: The pulmonary artery is of normal size.  Venous: The inferior vena cava is normal in size with greater than 50% respiratory variability, suggesting right atrial pressure of 3 mmHg. IAS/Shunts: No atrial level shunt detected by color flow Doppler.  LEFT VENTRICLE PLAX 2D LVIDd:         4.50 cm   Diastology LVIDs:         2.70 cm   LV e' medial:    4.46 cm/s LV PW:         1.10 cm   LV E/e' medial:  17.0 LV IVS:  1.00 cm   LV e' lateral:   5.11 cm/s LVOT diam:     2.00 cm   LV E/e' lateral: 14.9 LV SV:         57 LV SV Index:   33 LVOT Area:     3.14 cm  RIGHT VENTRICLE RV Basal diam:  3.50 cm RV Mid diam:    2.20 cm RV S prime:     14.90 cm/s TAPSE (M-mode): 2.3 cm LEFT ATRIUM             Index        RIGHT ATRIUM          Index LA diam:        2.80 cm 1.64 cm/m   RA Area:     9.83 cm LA Vol (A2C):   36.2 ml 21.23 ml/m  RA Volume:   19.50 ml 11.44 ml/m LA Vol (A4C):   19.8 ml 11.61 ml/m LA Biplane Vol: 26.7 ml 15.66 ml/m  AORTIC VALVE AV Area (Vmax):    2.05 cm AV Area (Vmean):   2.04 cm AV Area (VTI):     2.47 cm AV Vmax:           125.00 cm/s AV Vmean:          84.500 cm/s AV VTI:            0.230 m AV Peak Grad:      6.2 mmHg AV Mean Grad:      3.0 mmHg LVOT Vmax:         81.40 cm/s LVOT Vmean:        54.900 cm/s LVOT VTI:          0.181 m LVOT/AV VTI ratio: 0.79  AORTA Ao Root diam: 3.00 cm MITRAL VALVE MV Area (PHT): 2.85 cm     SHUNTS MV Area VTI:   2.15 cm     Systemic VTI:  0.18 m MV Peak grad:  6.2 mmHg     Systemic Diam: 2.00 cm MV Mean grad:  2.0 mmHg MV Vmax:       1.24 m/s MV Vmean:      69.4 cm/s MV Decel Time: 266 msec MV E velocity: 76.00 cm/s MV A velocity: 113.00 cm/s MV E/A ratio:  0.67 Cristal Deer End MD Electronically signed by Yvonne Kendall MD Signature Date/Time: 10/15/2022/9:33:29 AM    Final    CT ANGIO HEAD NECK W WO CM  Result Date: 10/14/2022 CLINICAL DATA:  Stroke/TIA, determine embolic source EXAM: CT ANGIOGRAPHY HEAD AND NECK WITH AND WITHOUT CONTRAST TECHNIQUE: Multidetector CT imaging of the head and  neck was performed using the standard protocol during bolus administration of intravenous contrast. Multiplanar CT image reconstructions and MIPs were obtained to evaluate the vascular anatomy. Carotid stenosis measurements (when applicable) are obtained utilizing NASCET criteria, using the distal internal carotid diameter as the denominator. RADIATION DOSE REDUCTION: This exam was performed according to the departmental dose-optimization program which includes automated exposure control, adjustment of the mA and/or kV according to patient size and/or use of iterative reconstruction technique. CONTRAST:  75mL OMNIPAQUE IOHEXOL 350 MG/ML SOLN COMPARISON:  Same day CT head. FINDINGS: CTA NECK FINDINGS Aortic arch: Great vessel origins are patent. Right carotid system: No evidence of dissection, stenosis (50% or greater), or occlusion. Left carotid system: No evidence of dissection, stenosis (50% or greater), or occlusion. Vertebral arteries: Left dominant. Both vertebral arteries are patent without hemodynamically significant stenosis. Skeleton: Bridging osteophytes in the lower  cervical and upper thoracic spine, suggestive of diffuse idiopathic skeletal hyperostosis. Other neck: No acute abnormality on limited assessment. Upper chest: Visualized lung apices are clear. Review of the MIP images confirms the above findings CTA HEAD FINDINGS Anterior circulation: Bilateral intracranial ICAs, MCAs, and ACAs are patent. Early left MCA bifurcation. 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 More distal M2 MCA branch remains patent. Moderate right paraclinoid ICA stenosis. Posterior circulation: Bilateral intradural vertebral arteries, basilar artery and bilateral posterior cerebral arteries are patent. Severe stenosis involving the bilateral P1 and P2 PCAs. Venous sinuses: No evidence of dural venous sinus thrombosis. Review of the MIP images  confirms the above findings IMPRESSION: 1. 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 More distal M2 MCA branch remains patent. 2. Severe stenosis involving the bilateral P1 and P2 PCAs. 3. Moderate right paraclinoid ICA stenosis. Electronically Signed   By: Feliberto Harts M.D.   On: 10/14/2022 15:46   MR BRAIN WO CONTRAST  Result Date: 10/14/2022 CLINICAL DATA:  Provided history: Neuro deficit, acute, stroke suspected. EXAM: MRI HEAD WITHOUT CONTRAST TECHNIQUE: Multiplanar, multiecho pulse sequences of the brain and surrounding structures were obtained without intravenous contrast. COMPARISON:  Head CT 10/14/2022. FINDINGS: Brain: No age advanced or lobar predominant parenchymal atrophy. Mild multifocal T2 FLAIR hyperintense signal abnormality within the cerebral white matter, nonspecific but likely reflecting chronic small vessel ischemic disease given the patient's history of diabetes mellitus, hypertension and hyperlipidemia. There is no acute infarct. No evidence of an intracranial mass. No chronic intracranial blood products. No extra-axial fluid collection. No midline shift. Vascular: Maintained flow voids within the proximal large arterial vessels. Skull and upper cervical spine: No focal suspicious marrow lesion. Sinuses/Orbits: No mass or acute finding within the imaged orbits. No significant paranasal sinus disease. Other: Trace fluid within the left mastoid air cells. IMPRESSION: 1.  No evidence of an acute intracranial abnormality. 2. Mild multifocal T2 FLAIR hyperintense signal abnormality within the cerebral white matter, nonspecific but likely reflecting chronic small vessel ischemic disease given the patient's history of diabetes mellitus, hypertension and hyperlipidemia. Electronically Signed   By: Jackey Loge D.O.   On: 10/14/2022 14:30   CT HEAD WO CONTRAST  Result Date: 10/14/2022 CLINICAL DATA:  Right  arm weakness EXAM: CT HEAD WITHOUT CONTRAST TECHNIQUE: Contiguous axial images were obtained from the base of the skull through the vertex without intravenous contrast. RADIATION DOSE REDUCTION: This exam was performed according to the departmental dose-optimization program which includes automated exposure control, adjustment of the mA and/or kV according to patient size and/or use of iterative reconstruction technique. COMPARISON:  None Available. FINDINGS: Brain: No evidence of acute infarction, hemorrhage, hydrocephalus, extra-axial collection or mass lesion/mass effect. Vascular: No hyperdense vessel or unexpected calcification. Skull: Normal. Negative for fracture or focal lesion. Sinuses/Orbits: No acute finding. Other: None. IMPRESSION: No acute intracranial abnormality. Electronically Signed   By: Duanne Guess D.O.   On: 10/14/2022 10:22    Labs: Basic Metabolic Panel: Recent Labs  Lab 10/14/22 0947  NA 136  K 3.7  CL 104  CO2 21*  GLUCOSE 297*  BUN 12  CREATININE 0.67  CALCIUM 9.4   CBC: Recent Labs  Lab 10/14/22 0947  WBC 6.3  6.3  NEUTROABS 4.1  HGB 14.9  14.7  HCT 43.3  42.7  MCV 91.4  91.0  PLT 290  279   Microbiology: No results found for this or any previous visit.  Time coordinating discharge: Over 30 minutes  Leeroy Bock, MD  [facility] 10/15/2022, 10:40 AM

## 2022-10-15 NOTE — Progress Notes (Signed)
   10/15/22 1000  Spiritual Encounters  Type of Visit Initial  Care provided to: Patient  Referral source Patient request  Reason for visit Advance directives  OnCall Visit No  Spiritual Framework  Presenting Themes Goals in life/care;Community and relationships  Community/Connection Family  Patient Stress Factors Not reviewed  Interventions  Spiritual Care Interventions Made Established relationship of care and support;Compassionate presence;Reflective listening  Intervention Outcomes  Outcomes Awareness of support  Spiritual Care Plan  Spiritual Care Issues Still Outstanding No further spiritual care needs at this time (see row info)   Patient requested advance directive. Educate patient on the advance directive and patient knowledge she understood. Patient is leaving hospital today and will get AD completed outside of medical facility.

## 2022-10-15 NOTE — Progress Notes (Signed)
*  PRELIMINARY RESULTS* Echocardiogram 2D Echocardiogram has been performed.  Cristela Blue 10/15/2022, 8:11 AM

## 2022-10-15 NOTE — Progress Notes (Signed)
Patient being discharged home. PIVs removed. Went over discharge instructions and medications with patient. Patient states she understands and all questions were answered. Patient going home POV with daughter. Patient refused wheelchair to be escorted out.

## 2022-10-15 NOTE — Discharge Instructions (Addendum)
Make a PCP appointment for hospital follow up and discussion of treating your diabetes in 1-2 weeks. Follow up with neurology in 8-12 weeks Return to the hospital AS SOON AS YOU HAVE SYMPTOMS if you have any similar symptoms to those that brought you here this time. Some treatments can be given to reverse damage in certain situations if you are here within a short window after onset so it's very important to not wait.  I provided diet information to look through. Remember, your new diabetes medications can cause some stomach upset in the beginning so take with food and your body will adjust. Increase your dose to twice per day in 2 weeks if not having side effects .   Your goal blood pressure, per neurology recommendations is less than 140/00. They do not recommend the more strict restriction of 120/80 due to you needing the pressure to help perfuse your brain and prevent strokes. Please let your PCP know of this recommendation and keep this in mind for your future medical treatments.  A1c goal less than 7  LDL goal less than 70    Some PCP options in [place] area- not a comprehensive list  [facility]- [phone] [facility]- [phone] [facility]- [phone] [facility]- [phone] [facility]- [phone] [facility]- [phone]  or [facility] Physician Referral Line [phone]

## 2022-10-15 NOTE — Progress Notes (Signed)
OT Cancellation Note  Patient Details Name: Kathleen Hickman MRN: [id number] DOB: November 01, 1955   Cancelled Treatment:    Reason Eval/Treat Not Completed: OT screened, no needs identified, will sign off. Pt reports returning to baseline with no deficits this morning. OT to complete orders.   Jackquline Denmark, MS, OTR/L , CBIS ascom [phone]  10/15/22, 9:31 AM

## 2022-10-15 NOTE — Consult Note (Signed)
Neurology Consultation  Reason for Consult: TIA Referring Physician: Dr. Marylene Land  CC: Right-sided weakness  History is obtained from: Patient,  HPI: Kathleen Hickman is a 67 y.o. female past medical history of diabetes, obesity, not following with a primary care physician, presented to the emergency room for evaluation of a brief episode of right-sided weakness and fall.  Reports last known well somewhere around Sunday 1 PM when she was not able to use the remote control.  She tried to stand up and fell due to leg weakness, predominantly on the right.  She also felt swimmy headed and felt that she was not able to get her words out although she did not speak to anybody at that time. Admitted for TIA workup given focal symptoms and risk factors. Denies any previous similar episodes Denies getting regular primary care-does not have a PCP. Preliminary workup negative in the emergency room.   LKW: 10/13/2022-1 PM IV thrombolysis given?: no, symptoms resolved by the time of presentation, also was outside the window at the time of presentation EVT: No-outside the window Premorbid modified Rankin scale (mRS):0   ROS: Full ROS was performed and is negative except as noted in the HPI.  Past Medical History:  Diagnosis Date   Diabetes mellitus without complication (HCC)    Hyperlipidemia    Hypertension      Family History  Problem Relation Age of Onset   Dementia Mother    Heart disease Father    Diabetes Father    Breast cancer Neg Hx      Social History:   reports that she has never smoked. She has never used smokeless tobacco. No history on file for alcohol use and drug use.  Medications  Current Facility-Administered Medications:     stroke: early stages of recovery book, , Does not apply, Once, Danford, Earl Lites, MD   acetaminophen (TYLENOL) tablet 650 mg, 650 mg, Oral, Q6H PRN **OR** acetaminophen (TYLENOL) suppository 650 mg, 650 mg, Rectal, Q6H PRN, Danford,  Earl Lites, MD   aspirin EC tablet 81 mg, 81 mg, Oral, Daily, Jamelle Rushing L, MD   benazepril (LOTENSIN) tablet 10 mg, 10 mg, Oral, Daily, Anderson, Chelsey L, MD   enoxaparin (LOVENOX) injection 40 mg, 40 mg, Subcutaneous, Q24H, Danford, Earl Lites, MD, 40 mg at 10/14/22 2149   glipiZIDE (GLUCOTROL XL) 24 hr tablet 2.5 mg, 2.5 mg, Oral, Q breakfast, Anderson, Chelsey L, MD   insulin aspart (novoLOG) injection 0-15 Units, 0-15 Units, Subcutaneous, TID WC, Danford, Earl Lites, MD, 5 Units at 10/14/22 1644   insulin aspart (novoLOG) injection 0-5 Units, 0-5 Units, Subcutaneous, QHS, Danford, Earl Lites, MD, 2 Units at 10/14/22 2150   living well with diabetes book MISC, , Does not apply, Once, Leeroy Bock, MD   [START ON 10/17/2022] metFORMIN (GLUCOPHAGE) tablet 500 mg, 500 mg, Oral, Q breakfast, Leeroy Bock, MD   metoprolol succinate (TOPROL-XL) 24 hr tablet 25 mg, 25 mg, Oral, Daily, Anderson, Chelsey L, MD   ondansetron (ZOFRAN) tablet 4 mg, 4 mg, Oral, Q6H PRN **OR** ondansetron (ZOFRAN) injection 4 mg, 4 mg, Intravenous, Q6H PRN, Danford, Earl Lites, MD   Exam: Current vital signs: BP (!) 165/73 (BP Location: Left Arm)   Pulse 83   Temp 98.8 F (37.1 C)   Resp 16   SpO2 96%  Vital signs in last 24 hours: Temp:  [98.1 F (36.7 C)-98.8 F (37.1 C)] 98.8 F (37.1 C) (06/25 0806) Pulse Rate:  [73-91] 83 (06/25  1308) Resp:  [16-23] 16 (06/25 0806) BP: (160-202)/(67-95) 165/73 (06/25 0806) SpO2:  [95 %-98 %] 96 % (06/25 0806)  GENERAL: Awake, alert in NAD HEENT: - Normocephalic and atraumatic, dry mm, no LN++, no Thyromegally LUNGS - Clear to auscultation bilaterally with no wheezes CV - S1S2 RRR, no m/r/g, equal pulses bilaterally. ABDOMEN - Soft, nontender, nondistended with normoactive BS Ext: warm, well perfused, intact peripheral pulses, __ edema  NEURO:  Mental Status: AA&Ox3  Language: speech is nondysarthric.  Naming, repetition,  fluency, and comprehension intact. Cranial Nerves: PERRL EOMI, visual fields full, no facial asymmetry, facial sensation intact, hearing intact, tongue/uvula/soft palate midline, normal sternocleidomastoid and trapezius muscle strength. No evidence of tongue atrophy or fibrillations Motor: 5/5 without drift in all extremities Tone: is normal and bulk is normal Sensation- Intact to light touch bilaterally Coordination: FTN intact bilaterally, no ataxia in BLE. Gait-normal NIHSS-0  Labs I have reviewed labs in epic and the results pertinent to this consultation are:  CBC    Component Value Date/Time   WBC 6.3 10/14/2022 0947   WBC 6.3 10/14/2022 0947   RBC 4.69 10/14/2022 0947   RBC 4.74 10/14/2022 0947   HGB 14.7 10/14/2022 0947   HGB 14.9 10/14/2022 0947   HCT 42.7 10/14/2022 0947   HCT 43.3 10/14/2022 0947   PLT 279 10/14/2022 0947   PLT 290 10/14/2022 0947   MCV 91.0 10/14/2022 0947   MCV 91.4 10/14/2022 0947   MCH 31.3 10/14/2022 0947   MCH 31.4 10/14/2022 0947   MCHC 34.4 10/14/2022 0947   MCHC 34.4 10/14/2022 0947   RDW 12.2 10/14/2022 0947   RDW 12.2 10/14/2022 0947   LYMPHSABS 1.7 10/14/2022 0947   MONOABS 0.4 10/14/2022 0947   EOSABS 0.1 10/14/2022 0947   BASOSABS 0.0 10/14/2022 0947    CMP     Component Value Date/Time   NA 136 10/14/2022 0947   K 3.7 10/14/2022 0947   CL 104 10/14/2022 0947   CO2 21 (L) 10/14/2022 0947   GLUCOSE 297 (H) 10/14/2022 0947   BUN 12 10/14/2022 0947   CREATININE 0.67 10/14/2022 0947   CALCIUM 9.4 10/14/2022 0947   PROT 7.6 10/14/2022 0947   ALBUMIN 4.3 10/14/2022 0947   AST 31 10/14/2022 0947   ALT 29 10/14/2022 0947   ALKPHOS 69 10/14/2022 0947   BILITOT 0.8 10/14/2022 0947   GFRNONAA >60 10/14/2022 0947   GFRAA  05/05/2009 1116    >60        The eGFR has been calculated using the MDRD equation. This calculation has not been validated in all clinical situations. eGFR's persistently <60 mL/min signify possible  Chronic Kidney Disease.    Lipid Panel     Component Value Date/Time   CHOL 211 (H) 10/15/2022 0516   TRIG 147 10/15/2022 0516   HDL 48 10/15/2022 0516   CHOLHDL 4.4 10/15/2022 0516   VLDL 29 10/15/2022 0516   LDLCALC 134 (H) 10/15/2022 0516    A1c-9.9 LDL 134  Imaging I have reviewed the images obtained:  CT head and MRI brain without acute changes. 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  More distal M2 MCA branches remain patent.  Severe stenosis involving bilateral P1 and P2 segments of the PCA.  Moderate right paraclinoid ICA stenosis.  Assessment:  67 year old with a history of diabetes, who does not follow regularly with primary  care presenting with an episode of right-sided weakness and fall from the weakness along with possible speech symptoms that lasted about 5 minutes consistent with a left hemispheric TIA. MRI is negative for stroke. CT angiography reveals a near occlusive stenosis of the left M1 segment of the MCA along with multifocal intracranial atherosclerosis likely related to underlying risk factors such as diabetes and hyperlipidemia that have not been optimally treated. At this time, management based on SAMMPRIS trial with dual antiplatelets and high intensity statin for 3 months.   Impression:  Left hemispheric TIA secondary to intracranial atherosclerosis-left M1 near occlusive stenosis likely the culprit lesion for this current presentation but has other multifocal areas of intracranial atherosclerosis in the anterior and posterior circulation as well. Diabetes Hyperlipidemia   Recommendations: 2D echo pending Continue telemetry Frequent neurochecks Avoid hypotension.  Given significant intracranial atherosclerosis, I would recommend that her long-term blood pressure goal not be 120/80 or below but rather however around 140/90 and strict  avoidance of hypotension. At this time, I would recommend aspirin 325+ Plavix 75+ atorvastatin 80 for 3 months and aggressive lifestyle changes including consumption of Mediterranean diet as well as regular exercising. She does not smoke or abuse drugs. PT OT speech therapy A1c goal less than 7 LDL goal less than 70 Outpatient neurology follow-up in 8 to 12 weeks-she would prefer [facility] clinic neurology-please provide number at discharge. She will need to be hooked up to a PCP-please provide referral.  2D echocardiogram is pending-will follow  Plan was discussed in detail with the patient Plan was relayed to Dr. Dareen Piano   Addendum 2D echocardiogram completed.  LVEF 60 to 65%.  LVH and grade 1 diastolic dysfunction.  Mitral valve is degenerative with mild regurgitation and no evidence of mitral stenosis.  Atrial valve is tricuspid with trivial regurgitation.  Left atrium size is normal.  No shunt by color-flow Doppler. Recommendations as above. -- Milon Dikes, MD Neurologist [facility] Pager: [phone]

## 2022-10-17 ENCOUNTER — Telehealth: Payer: Self-pay | Admitting: *Deleted

## 2022-10-28 ENCOUNTER — Other Ambulatory Visit: Payer: Self-pay | Admitting: Nurse Practitioner

## 2022-10-28 DIAGNOSIS — Z1231 Encounter for screening mammogram for malignant neoplasm of breast: Secondary | ICD-10-CM

## 2022-11-05 ENCOUNTER — Ambulatory Visit
Admission: RE | Admit: 2022-11-05 | Discharge: 2022-11-05 | Disposition: A | Discharge status: Home / Self Care | Payer: PPO | Source: Ambulatory Visit | Attending: Nurse Practitioner | Admitting: Nurse Practitioner

## 2022-11-05 DIAGNOSIS — Z1231 Encounter for screening mammogram for malignant neoplasm of breast: Secondary | ICD-10-CM | POA: Diagnosis not present

## 2022-11-06 NOTE — Progress Notes (Signed)
BP 126/84   Pulse 84   Temp 98 F (36.7 C) (Oral)   Resp 16   Ht 4\' 10"  (1.473 m)   Wt 171 lb 12.8 oz (77.9 kg)   SpO2 94%   BMI 35.91 kg/m    Subjective:    Patient ID: Kathleen Hickman, female    DOB: 10-May-1955, 67 y.o.   MRN: [id number]  HPI: Kathleen Hickman is a 67 y.o. female  Chief Complaint  Patient presents with   Establish Care   Diabetes    Was inpatient at [facility]   Hypertension   Hyperlipidemia   Establish care: her last physical was a few years ago.  Medical history includes htn, dm, hld, tia.  Family history includes heart disease, hld, dm, alzheimer's.  Health maintenance recently had labs,  cologuard, mammogram .   Hypertension:  -Medications: metoprolol 25 mg daily -Patient is compliant with above medications and reports no side effects. -Checking BP at home (average): not  checking at this time,  ordered cuff -Denies any SOB, CP, vision changes, LE edema or symptoms of hypotension -Diet: recommend DASH diet -Exercise: increase physical activity as tolerated   Diabetes, Type 2:  -Last A1c 9.9 -Medications: metformin 500 mg daily, she is increasing to 500 mg BID and change to extended release -Patient is compliant with the above medications and reports she was having diarrhea.  -Checking BG at home: yes -Highest home BG since last visit: 250 -Lowest home BG since last visit: 164 -Diet: reduce sugar and processed foods  -Exercise: increase physical activity as tolerated -Eye exam: due -Foot exam: due -Microalbumin: due -Statin: yes -Denies symptoms of hypoglycemia, polyuria, polydipsia, numbness extremities, foot ulcers/trauma.    HLD:  -Medications: atorvastatin 80 mg daily -Patient is compliant with above medications and reports no side effects.  -Last lipid panel:   Lipid Panel     Component Value Date/Time   CHOL 211 (H) 10/15/2022 0516   TRIG 147 10/15/2022 0516   HDL 48 10/15/2022 0516   CHOLHDL 4.4 10/15/2022 0516   VLDL 29  10/15/2022 0516   LDLCALC 134 (H) 10/15/2022 0516   The 10-year ASCVD risk score (Arnett DK, et al., 2019) is: 17.6%   Values used to calculate the score:     Age: 1 years     Sex: Female     Is Non-Hispanic African American: No     Diabetic: Yes     Tobacco smoker: No     Systolic Blood Pressure: 126 mmHg     Is BP treated: Yes     HDL Cholesterol: 48 mg/dL     Total Cholesterol: 211 mg/dL   Obesity:  Current weight : 171 lbs BMI: 35.91 Treatment Tried: lifestyle modification Comorbidities: HTN, HLD, DM   Hospital follow up/TIA;  patient was admitted on 10/14/2022 and discharged on 10/15/2022.   They presented from home to the ED on 10/14/2022 with right sided weakness that occurred a couple days prior to presentation to the ED. She was watching TV when it occurred and lasted for several hours. She felt as though her right arm and leg were dead. She presented to the UC with her daughters insistence who then directed her to the ED.    In the ED, it was found that they had stable vital signs with the exception of severely elevated blood pressures with iniital systolic of 202/95.  Significant findings included unremarkable CBC, metabolic panel, and negative orthostatic vitals. CT head was negative  acute findings.  Brain MRI also negative for acute abnormality but showing signs of likely chronic small vessel ischemic disease.  CTA head and neck showed 1. 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 More distal M2 MCA branch remains patent. 2. Severe stenosis involving the bilateral P1 and P2 PCAs. 3. Moderate right paraclinoid ICA stenosis.    They were initially treated with aspirin, insulin. Neurology was consulted.     Patient was admitted to medicine service for further workup and management of TIA and hypertensive urgency as outlined in detail below.   10/15/22 -She had resolution of all symptoms  prior to admission and completed further workup of her TIA including echo which was unremarkable. She was allowed permissive hypertension for the first 24 hours and then gradually restarted home blood pressure medications. She was started on plavix and aspirin as well as statin therapy. PT/OT evaluated and did not recommend further therapy needed.   hgb A1c was 9.9. patient received diabetes education and started on metformin and glipizide.    Discharge Condition: Good, improved Recommended discharge diet:  mediterranean diet   Consultations: Neurology    Procedures/Studies: Echo   Patient reports she had an episode where she could not move her right arm, she collapsed to the floor.  She says that when she got up she was incontinent got up went and took a shower.  She then went to the er several hours later.  She has an appointment with neurology on 11/19/2022 with Dr. Malvin Johns.  She is currently taking plavix 75 mg daily and asa 81 mg daily.   Relevant past medical, surgical, family and social history reviewed and updated as indicated. Interim medical history since our last visit reviewed. Allergies and medications reviewed and updated.  Review of Systems  Constitutional: Negative for fever or weight change.  Respiratory: Negative for cough and shortness of breath.   Cardiovascular: Negative for chest pain or palpitations.  Gastrointestinal: Negative for abdominal pain, no bowel changes.  Musculoskeletal: Negative for gait problem or joint swelling.  Skin: Negative for rash.  Neurological: Negative for dizziness or headache.  No other specific complaints in a complete review of systems (except as listed in HPI above).      Objective:    BP 126/84   Pulse 84   Temp 98 F (36.7 C) (Oral)   Resp 16   Ht 4\' 10"  (1.473 m)   Wt 171 lb 12.8 oz (77.9 kg)   SpO2 94%   BMI 35.91 kg/m   Wt Readings from Last 3 Encounters:  11/07/22 171 lb 12.8 oz (77.9 kg)  09/27/21 166 lb 3.2 oz (75.4  kg)  03/07/21 155 lb (70.3 kg)    Physical Exam  Constitutional: Patient appears well-developed and well-nourished. Obese  No distress.  HEENT: head atraumatic, normocephalic, pupils equal and reactive to light, neck supple, throat within normal limits Cardiovascular: Normal rate, regular rhythm and normal heart sounds.  No murmur heard. No BLE edema. Pulmonary/Chest: Effort normal and breath sounds normal. No respiratory distress. Abdominal: Soft.  There is no tenderness. Neuro:  equal grip, no arm drift, normal gait Psychiatric: Patient has a normal mood and affect. behavior is normal. Judgment and thought content normal.  Diabetic Foot Exam - Simple   Simple Foot Form Diabetic Foot exam was performed with the following findings: Yes 11/07/2022 10:31 AM  Visual Inspection No deformities, no ulcerations, no other skin  breakdown bilaterally: Yes Sensation Testing Intact to touch and monofilament testing bilaterally: Yes Pulse Check Posterior Tibialis and Dorsalis pulse intact bilaterally: Yes Comments     Results for orders placed or performed during the hospital encounter of 10/14/22  Protime-INR  Result Value Ref Range   Prothrombin Time 13.8 11.4 - 15.2 seconds   INR 1.0 0.8 - 1.2  APTT  Result Value Ref Range   aPTT 29 24 - 36 seconds  CBC  Result Value Ref Range   WBC 6.3 4.0 - 10.5 K/uL   RBC 4.69 3.87 - 5.11 MIL/uL   Hemoglobin 14.7 12.0 - 15.0 g/dL   HCT 16.1 09.6 - 04.5 %   MCV 91.0 80.0 - 100.0 fL   MCH 31.3 26.0 - 34.0 pg   MCHC 34.4 30.0 - 36.0 g/dL   RDW 40.9 81.1 - 91.4 %   Platelets 279 150 - 400 K/uL   nRBC 0.0 0.0 - 0.2 %  Comprehensive metabolic panel  Result Value Ref Range   Sodium 136 135 - 145 mmol/L   Potassium 3.7 3.5 - 5.1 mmol/L   Chloride 104 98 - 111 mmol/L   CO2 21 (L) 22 - 32 mmol/L   Glucose, Bld 297 (H) 70 - 99 mg/dL   BUN 12 8 - 23 mg/dL   Creatinine, Ser 7.82 0.44 - 1.00 mg/dL   Calcium 9.4 8.9 - 95.6 mg/dL   Total Protein 7.6 6.5  - 8.1 g/dL   Albumin 4.3 3.5 - 5.0 g/dL   AST 31 15 - 41 U/L   ALT 29 0 - 44 U/L   Alkaline Phosphatase 69 38 - 126 U/L   Total Bilirubin 0.8 0.3 - 1.2 mg/dL   GFR, Estimated >21 >30 mL/min   Anion gap 11 5 - 15  Ethanol  Result Value Ref Range   Alcohol, Ethyl (B) <10 <10 mg/dL  Urinalysis, Routine w reflex microscopic -Urine, Clean Catch  Result Value Ref Range   Color, Urine YELLOW (A) YELLOW   APPearance CLEAR (A) CLEAR   Specific Gravity, Urine 1.018 1.005 - 1.030   pH 6.0 5.0 - 8.0   Glucose, UA >=500 (A) NEGATIVE mg/dL   Hgb urine dipstick NEGATIVE NEGATIVE   Bilirubin Urine NEGATIVE NEGATIVE   Ketones, ur NEGATIVE NEGATIVE mg/dL   Protein, ur NEGATIVE NEGATIVE mg/dL   Nitrite NEGATIVE NEGATIVE   Leukocytes,Ua NEGATIVE NEGATIVE   WBC, UA 0-5 0 - 5 WBC/hpf   Bacteria, UA NONE SEEN NONE SEEN   Squamous Epithelial / HPF 0-5 0 - 5 /HPF  CBC with Differential/Platelet  Result Value Ref Range   WBC 6.3 4.0 - 10.5 K/uL   RBC 4.74 3.87 - 5.11 MIL/uL   Hemoglobin 14.9 12.0 - 15.0 g/dL   HCT 86.5 78.4 - 69.6 %   MCV 91.4 80.0 - 100.0 fL   MCH 31.4 26.0 - 34.0 pg   MCHC 34.4 30.0 - 36.0 g/dL   RDW 29.5 28.4 - 13.2 %   Platelets 290 150 - 400 K/uL   nRBC 0.0 0.0 - 0.2 %   Neutrophils Relative % 65 %   Neutro Abs 4.1 1.7 - 7.7 K/uL   Lymphocytes Relative 26 %   Lymphs Abs 1.7 0.7 - 4.0 K/uL   Monocytes Relative 7 %   Monocytes Absolute 0.4 0.1 - 1.0 K/uL   Eosinophils Relative 2 %   Eosinophils Absolute 0.1 0.0 - 0.5 K/uL   Basophils Relative 0 %   Basophils Absolute  0.0 0.0 - 0.1 K/uL   Immature Granulocytes 0 %   Abs Immature Granulocytes 0.01 0.00 - 0.07 K/uL  Hemoglobin A1c  Result Value Ref Range   Hgb A1c MFr Bld 9.9 (H) 4.8 - 5.6 %   Mean Plasma Glucose 237.43 mg/dL  Lipid panel  Result Value Ref Range   Cholesterol 204 (H) 0 - 200 mg/dL   Triglycerides 295 <621 mg/dL   HDL 51 >30 mg/dL   Total CHOL/HDL Ratio 4.0 RATIO   VLDL 22 0 - 40 mg/dL   LDL  Cholesterol 865 (H) 0 - 99 mg/dL  Glucose, capillary  Result Value Ref Range   Glucose-Capillary 218 (H) 70 - 99 mg/dL  Lipid panel  Result Value Ref Range   Cholesterol 211 (H) 0 - 200 mg/dL   Triglycerides 784 <696 mg/dL   HDL 48 >29 mg/dL   Total CHOL/HDL Ratio 4.4 RATIO   VLDL 29 0 - 40 mg/dL   LDL Cholesterol 528 (H) 0 - 99 mg/dL  HIV Antibody (routine testing w rflx)  Result Value Ref Range   HIV Screen 4th Generation wRfx Non Reactive Non Reactive  Glucose, capillary  Result Value Ref Range   Glucose-Capillary 238 (H) 70 - 99 mg/dL  CBG monitoring, ED  Result Value Ref Range   Glucose-Capillary 213 (H) 70 - 99 mg/dL  CBG monitoring, ED  Result Value Ref Range   Glucose-Capillary 224 (H) 70 - 99 mg/dL  ECHOCARDIOGRAM COMPLETE  Result Value Ref Range   BP 165/94 mmHg   Ao pk vel 1.25 m/s   AV Area VTI 2.47 cm2   AR max vel 2.05 cm2   AV Mean grad 3.0 mmHg   AV Peak grad 6.3 mmHg   S' Lateral 2.70 cm   AV Area mean vel 2.04 cm2   Area-P 1/2 2.85 cm2   MV VTI 2.15 cm2   Est EF 60 - 65%       Assessment & Plan:   Problem List Items Addressed This Visit       Cardiovascular and Mediastinum   TIA (transient ischemic attack)    Has appointment with neurology on July 30 with Dr. Malvin Johns.  Continue taking Plavix 75 mg daily and aspirin 81 mg daily.      Essential hypertension    Blood pressure  today 126/84 continue taking metoprolol 25 mg daily.  Recommend DASH diet.        Endocrine   Uncontrolled type 2 diabetes mellitus with hyperglycemia, without long-term current use of insulin (HCC) - Primary    A1C was 9.9.  Patient's been taking metformin 500 mg daily.  She increased it to 2 times a day however was having diarrhea.  Will switch metformin to extended release and continue 500 mg twice daily.      Relevant Medications   metFORMIN (GLUCOPHAGE-XR) 500 MG 24 hr tablet   Other Relevant Orders   Microalbumin / creatinine urine ratio   HM Diabetes Foot Exam  (Completed)     Other   Obesity (BMI 30-39.9)    Continue working on lifestyle modification including eating a well-balanced diet with portion control and increasing physical activity as tolerated.      Relevant Medications   metFORMIN (GLUCOPHAGE-XR) 500 MG 24 hr tablet   Mixed hyperlipidemia    Continue taking atorvastatin 80 mg daily.       Other Visit Diagnoses     Encounter to establish care       Past  records        Follow up plan: Return in about 3 months (around 02/07/2023) for follow up.

## 2022-11-07 ENCOUNTER — Ambulatory Visit (INDEPENDENT_AMBULATORY_CARE_PROVIDER_SITE_OTHER): Payer: PPO | Admitting: Nurse Practitioner

## 2022-11-07 ENCOUNTER — Other Ambulatory Visit: Payer: Self-pay

## 2022-11-07 ENCOUNTER — Encounter: Payer: Self-pay | Admitting: Nurse Practitioner

## 2022-11-07 VITALS — BP 126/84 | HR 84 | Temp 98.0°F | Resp 16 | Ht <= 58 in | Wt 171.8 lb

## 2022-11-07 DIAGNOSIS — Z8673 Personal history of transient ischemic attack (TIA), and cerebral infarction without residual deficits: Secondary | ICD-10-CM | POA: Diagnosis not present

## 2022-11-07 DIAGNOSIS — I10 Essential (primary) hypertension: Secondary | ICD-10-CM

## 2022-11-07 DIAGNOSIS — E669 Obesity, unspecified: Secondary | ICD-10-CM

## 2022-11-07 DIAGNOSIS — E782 Mixed hyperlipidemia: Secondary | ICD-10-CM

## 2022-11-07 DIAGNOSIS — E1165 Type 2 diabetes mellitus with hyperglycemia: Secondary | ICD-10-CM | POA: Diagnosis not present

## 2022-11-07 DIAGNOSIS — Z7689 Persons encountering health services in other specified circumstances: Secondary | ICD-10-CM

## 2022-11-07 MED ORDER — METFORMIN HCL ER 500 MG PO TB24
500.0000 mg | ORAL_TABLET | Freq: Two times a day (BID) | ORAL | 1 refills | Status: DC
Start: 2022-11-07 — End: 2023-06-27

## 2022-11-07 NOTE — Assessment & Plan Note (Signed)
Continue working on lifestyle modification including eating a well-balanced diet with portion control and increasing physical activity as tolerated

## 2022-11-07 NOTE — Assessment & Plan Note (Signed)
Continue taking atorvastatin 80 mg daily 

## 2022-11-07 NOTE — Assessment & Plan Note (Addendum)
Blood pressure  today 126/84 continue taking metoprolol 25 mg daily.  Recommend DASH diet.

## 2022-11-07 NOTE — Assessment & Plan Note (Signed)
Has appointment with neurology on July 30 with Dr. Malvin Johns.  Continue taking Plavix 75 mg daily and aspirin 81 mg daily.

## 2022-11-07 NOTE — Assessment & Plan Note (Signed)
A1C was 9.9.  Patient's been taking metformin 500 mg daily.  She increased it to 2 times a day however was having diarrhea.  Will switch metformin to extended release and continue 500 mg twice daily.

## 2022-11-08 LAB — MICROALBUMIN / CREATININE URINE RATIO
Creatinine, Urine: 71 mg/dL (ref 20–275)
Microalb Creat Ratio: 34 mg/g creat — ABNORMAL HIGH (ref <30)
Microalb, Ur: 2.4 mg/dL

## 2022-12-17 NOTE — Progress Notes (Unsigned)
Name: Kathleen Hickman   MRN: [id number]    DOB: 10/14/1955   Date:12/19/2022       Progress Note  Subjective  Chief Complaint  Chief Complaint  Patient presents with   Annual Exam    HPI  Patient presents for annual CPE.  Diet: Regular, tries to eat well balanced diet Exercise: 3 days a week 60 minutes  Last Eye Exam: December 2023 Last Dental Exam: last year  Flowsheet Row Office Visit from 12/19/2022 in [facility]  AUDIT-C Score 0      Depression: Phq 9 is  negative    12/19/2022    8:35 AM 11/07/2022    9:23 AM 01/07/2017    8:59 AM  Depression screen PHQ 2/9  Decreased Interest 0 0 1  Down, Depressed, Hopeless 0 0 0  PHQ - 2 Score 0 0 1   Hypertension: BP Readings from Last 3 Encounters:  12/19/22 132/84  11/07/22 126/84  10/15/22 (!) 165/73   Obesity: Wt Readings from Last 3 Encounters:  12/19/22 173 lb 9.6 oz (78.7 kg)  11/07/22 171 lb 12.8 oz (77.9 kg)  09/27/21 166 lb 3.2 oz (75.4 kg)   BMI Readings from Last 3 Encounters:  12/19/22 36.28 kg/m  11/07/22 35.91 kg/m  09/27/21 33.57 kg/m     Vaccines:  HPV: up to at age 36 , ask insurance if age between 69-45  Shingrix: 5-64 yo and ask insurance if covered when patient above 27 yo Pneumonia:  educated and discussed with patient. Flu:  educated and discussed with patient.     Hep C Screening: due, recently had labs will get this at next appointment STD testing and prevention (HIV/chl/gon/syphilis): completed Intimate partner violence: negative screen  Sexual History :not currently Menstrual History/LMP/Abnormal Bleeding: postmenopausal Discussed importance of follow up if any post-menopausal bleeding: yes  Incontinence Symptoms: negative for symptoms   Breast cancer:  - Last Mammogram: 11/06/2022 - BRCA gene screening: none  Osteoporosis Prevention : Discussed high calcium and vitamin D supplementation, weight bearing exercises Bone density :yes  08/27/2021  Cervical cancer screening: aged out  Skin cancer: Discussed monitoring for atypical lesions  Colorectal cancer: cologuard done 2 years ago   Lung cancer:  Low Dose CT Chest recommended if Age 82-80 years, 20 pack-year currently smoking OR have quit w/in 15years. Patient does not qualify for screen   ECG: 10/15/2022  Advanced Care Planning: A voluntary discussion about advance care planning including the explanation and discussion of advance directives.  Discussed health care proxy and Living will, and the patient was able to identify a health care proxy as daughter Rihana.  Patient does not have a living will and power of attorney of health care   Lipids: Lab Results  Component Value Date   CHOL 211 (H) 10/15/2022   CHOL 204 (H) 10/14/2022   Lab Results  Component Value Date   HDL 48 10/15/2022   HDL 51 10/14/2022   Lab Results  Component Value Date   LDLCALC 134 (H) 10/15/2022   LDLCALC 131 (H) 10/14/2022   Lab Results  Component Value Date   TRIG 147 10/15/2022   TRIG 112 10/14/2022   Lab Results  Component Value Date   CHOLHDL 4.4 10/15/2022   CHOLHDL 4.0 10/14/2022   No results found for: "LDLDIRECT"  Glucose: Glucose, Bld  Date Value Ref Range Status  10/14/2022 297 (H) 70 - 99 mg/dL Final    Comment:    Glucose reference range applies  only to samples taken after fasting for at least 8 hours.  05/05/2009 160 (H) 70 - 99 mg/dL Final   Glucose-Capillary  Date Value Ref Range Status  10/15/2022 238 (H) 70 - 99 mg/dL Final    Comment:    Glucose reference range applies only to samples taken after fasting for at least 8 hours.  10/14/2022 218 (H) 70 - 99 mg/dL Final    Comment:    Glucose reference range applies only to samples taken after fasting for at least 8 hours.  10/14/2022 224 (H) 70 - 99 mg/dL Final    Comment:    Glucose reference range applies only to samples taken after fasting for at least 8 hours.    Patient Active Problem List    Diagnosis Date Noted   Mixed hyperlipidemia 11/07/2022   TIA (transient ischemic attack) 10/14/2022   Obesity (BMI 30-39.9) 10/14/2022   Uncontrolled type 2 diabetes mellitus with hyperglycemia, without long-term current use of insulin (HCC) 10/14/2022   Essential hypertension 10/14/2022    Past Surgical History:  Procedure Laterality Date   CARPAL TUNNEL WITH CUBITAL TUNNEL Right    CHOLECYSTECTOMY     HAND SURGERY Right    fracture repair in 1990   SHOULDER SURGERY Right    SPINE SURGERY     TUBAL LIGATION      Family History  Problem Relation Age of Onset   Dementia Mother    Heart disease Father    Diabetes Father    Breast cancer Neg Hx     Social History   Socioeconomic History   Marital status: Single    Spouse name: Not on file   Number of children: 1   Years of education: Not on file   Highest education level: Not on file  Occupational History   Not on file  Tobacco Use   Smoking status: Never    Passive exposure: Never   Smokeless tobacco: Never  Vaping Use   Vaping status: Never Used  Substance and Sexual Activity   Alcohol use: Not Currently    Alcohol/week: 2.0 standard drinks of alcohol    Types: 2 Cans of beer per week   Drug use: Never   Sexual activity: Yes  Other Topics Concern   Not on file  Social History Narrative   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 Lives alone.     Social Determinants of Health   Financial Resource Strain: Low Risk  (12/19/2022)   Overall Financial Resource Strain (CARDIA)    Difficulty of Paying Living Expenses: Not hard at all  Food Insecurity: No Food Insecurity (12/19/2022)   Hunger Vital Sign    Worried About Running Out of Food in the Last Year: Never true    Ran Out of Food in the Last Year: Never true  Transportation Needs: No Transportation Needs (12/19/2022)   PRAPARE - Administrator, Civil Service (Medical): No    Lack of Transportation (Non-Medical): No  Physical Activity:  Sufficiently Active (12/19/2022)   Exercise Vital Sign    Days of Exercise per Week: 3 days    Minutes of Exercise per Session: 60 min  Stress: No Stress Concern Present (12/19/2022)   [employer] of Occupational Health - Occupational Stress Questionnaire    Feeling of Stress : Only a little  Social Connections: Moderately Isolated (12/19/2022)   Social Connection and Isolation Panel [NHANES]    Frequency of Communication with Friends and Family: More than three times  a week    Frequency of Social Gatherings with Friends and Family: More than three times a week    Attends Religious Services: Never    Database administrator or Organizations: No    Attends Banker Meetings: Never    Marital Status: Married  Catering manager Violence: Not At Risk (12/19/2022)   Humiliation, Afraid, Rape, and Kick questionnaire    Fear of Current or Ex-Partner: No    Emotionally Abused: No    Physically Abused: No    Sexually Abused: No     Current Outpatient Medications:    aspirin EC 325 MG tablet, Take 1 tablet (325 mg total) by mouth daily. Swallow whole., Disp: 90 tablet, Rfl: 0   atorvastatin (LIPITOR) 80 MG tablet, Take 1 tablet (80 mg total) by mouth daily., Disp: 90 tablet, Rfl: 0   clopidogrel (PLAVIX) 75 MG tablet, Take 1 tablet (75 mg total) by mouth daily., Disp: 90 tablet, Rfl: 0   glipiZIDE (GLUCOTROL XL) 2.5 MG 24 hr tablet, Take 1 tablet (2.5 mg total) by mouth daily with breakfast., Disp: 90 tablet, Rfl: 0   metFORMIN (GLUCOPHAGE-XR) 500 MG 24 hr tablet, Take 1 tablet (500 mg total) by mouth 2 (two) times daily with a meal., Disp: 180 tablet, Rfl: 1   metoprolol succinate (TOPROL-XL) 25 MG 24 hr tablet, Take 1 tablet (25 mg total) by mouth daily., Disp: 90 tablet, Rfl: 0   Blood Glucose Monitoring Suppl (ONETOUCH VERIO REFLECT) w/Device KIT, , Disp: , Rfl:   Allergies  Allergen Reactions   Morphine And Codeine Anaphylaxis   Aspirin Nausea Only    Pt states can take  enteric coated low-dose aspirin.   Ciprofloxacin Hives   Codeine Other (See Comments)    Thrush in the mouth   Elemental Sulfur Hives   Influenza Vaccines     Knot at injection site   Penicillin G Rash     ROS  Constitutional: Negative for fever or weight change.  Respiratory: Negative for cough and shortness of breath.   Cardiovascular: Negative for chest pain or palpitations.  Gastrointestinal: Negative for abdominal pain, no bowel changes.  Musculoskeletal: Negative for gait problem or joint swelling.  Skin: Negative for rash.  Neurological: Negative for dizziness or headache.  No other specific complaints in a complete review of systems (except as listed in HPI above).   Objective  Vitals:   12/19/22 0830  BP: 132/84  Pulse: 77  Resp: 16  Temp: 97.9 F (36.6 C)  TempSrc: Oral  SpO2: 95%  Weight: 173 lb 9.6 oz (78.7 kg)  Height: 4\' 10"  (1.473 m)    Body mass index is 36.28 kg/m.  Physical Exam Constitutional: Patient appears well-developed and well-nourished. No distress.  HENT: Head: Normocephalic and atraumatic. Ears: B TMs ok, no erythema or effusion; Nose: Nose normal. Mouth/Throat: Oropharynx is clear and moist. No oropharyngeal exudate.  Eyes: Conjunctivae and EOM are normal. Pupils are equal, round, and reactive to light. No scleral icterus.  Neck: Normal range of motion. Neck supple. No JVD present. No thyromegaly present.  Cardiovascular: Normal rate, regular rhythm and normal heart sounds.  No murmur heard. No BLE edema. Pulmonary/Chest: Effort normal and breath sounds normal. No respiratory distress. Abdominal: Soft. Bowel sounds are normal, no distension. There is no tenderness. no masses Breast: no lumps or masses, no nipple discharge or rashes Musculoskeletal: Normal range of motion, no joint effusions. No gross deformities Neurological: he is alert and oriented to person, place,  and time. No cranial nerve deficit. Coordination, balance, strength,  speech and gait are normal.  Skin: Skin is warm and dry. No rash noted. No erythema.  Psychiatric: Patient has a normal mood and affect. behavior is normal. Judgment and thought content normal.   Recent Results (from the past 2160 hour(s))  Protime-INR     Status: None   Collection Time: 10/14/22  9:47 AM  Result Value Ref Range   Prothrombin Time 13.8 11.4 - 15.2 seconds   INR 1.0 0.8 - 1.2    Comment: (NOTE) INR goal varies based on device and disease states. Performed at [facility], [address] [place], [place] [address]   APTT     Status: None   Collection Time: 10/14/22  9:47 AM  Result Value Ref Range   aPTT 29 24 - 36 seconds    Comment: Performed at [facility], [address] [place], [place] [address]  CBC     Status: None   Collection Time: 10/14/22  9:47 AM  Result Value Ref Range   WBC 6.3 4.0 - 10.5 K/uL   RBC 4.69 3.87 - 5.11 MIL/uL   Hemoglobin 14.7 12.0 - 15.0 g/dL   HCT 82.9 56.2 - 13.0 %   MCV 91.0 80.0 - 100.0 fL   MCH 31.3 26.0 - 34.0 pg   MCHC 34.4 30.0 - 36.0 g/dL   RDW 86.5 78.4 - 69.6 %   Platelets 279 150 - 400 K/uL   nRBC 0.0 0.0 - 0.2 %    Comment: Performed at [facility], [address], [place], [place] [address]  Comprehensive metabolic panel     Status: Abnormal   Collection Time: 10/14/22  9:47 AM  Result Value Ref Range   Sodium 136 135 - 145 mmol/L   Potassium 3.7 3.5 - 5.1 mmol/L   Chloride 104 98 - 111 mmol/L   CO2 21 (L) 22 - 32 mmol/L   Glucose, Bld 297 (H) 70 - 99 mg/dL    Comment: Glucose reference range applies only to samples taken after fasting for at least 8 hours.   BUN 12 8 - 23 mg/dL   Creatinine, Ser 4.13 0.44 - 1.00 mg/dL   Calcium 9.4 8.9 - 24.4 mg/dL   Total Protein 7.6 6.5 - 8.1 g/dL   Albumin 4.3 3.5 - 5.0 g/dL   AST 31 15 - 41 U/L   ALT 29 0 - 44 U/L   Alkaline Phosphatase 69 38 - 126 U/L   Total Bilirubin 0.8 0.3 - 1.2 mg/dL   GFR, Estimated >01 >02 mL/min    Comment:  (NOTE) Calculated using the CKD-EPI Creatinine Equation (2021)    Anion gap 11 5 - 15    Comment: Performed at [facility], [address] [place], [place] [address]  CBC with Differential/Platelet     Status: None   Collection Time: 10/14/22  9:47 AM  Result Value Ref Range   WBC 6.3 4.0 - 10.5 K/uL   RBC 4.74 3.87 - 5.11 MIL/uL   Hemoglobin 14.9 12.0 - 15.0 g/dL   HCT 64.4 03.4 - 74.2 %   MCV 91.4 80.0 - 100.0 fL   MCH 31.4 26.0 - 34.0 pg   MCHC 34.4 30.0 - 36.0 g/dL   RDW 59.5 63.8 - 75.6 %   Platelets 290 150 - 400 K/uL   nRBC 0.0 0.0 - 0.2 %   Neutrophils Relative % 65 %   Neutro Abs 4.1  1.7 - 7.7 K/uL   Lymphocytes Relative 26 %   Lymphs Abs 1.7 0.7 - 4.0 K/uL   Monocytes Relative 7 %   Monocytes Absolute 0.4 0.1 - 1.0 K/uL   Eosinophils Relative 2 %   Eosinophils Absolute 0.1 0.0 - 0.5 K/uL   Basophils Relative 0 %   Basophils Absolute 0.0 0.0 - 0.1 K/uL   Immature Granulocytes 0 %   Abs Immature Granulocytes 0.01 0.00 - 0.07 K/uL    Comment: Performed at [facility], [address] [place], [place] [address]  Hemoglobin A1c     Status: Abnormal   Collection Time: 10/14/22  9:47 AM  Result Value Ref Range   Hgb A1c MFr Bld 9.9 (H) 4.8 - 5.6 %    Comment: (NOTE) Pre diabetes:          5.7%-6.4%  Diabetes:              >6.4%  Glycemic control for   <7.0% adults with diabetes    Mean Plasma Glucose 237.43 mg/dL    Comment: Performed at [facility], [address] [place], [place] [address]  Urinalysis, Routine w reflex microscopic -Urine, Clean Catch     Status: Abnormal   Collection Time: 10/14/22 11:42 AM  Result Value Ref Range   Color, Urine YELLOW (A) YELLOW   APPearance CLEAR (A) CLEAR   Specific Gravity, Urine 1.018 1.005 - 1.030   pH 6.0 5.0 - 8.0   Glucose, UA >=500 (A) NEGATIVE mg/dL   Hgb urine dipstick NEGATIVE NEGATIVE   Bilirubin Urine NEGATIVE NEGATIVE   Ketones, ur NEGATIVE NEGATIVE mg/dL   Protein, ur  NEGATIVE NEGATIVE mg/dL   Nitrite NEGATIVE NEGATIVE   Leukocytes,Ua NEGATIVE NEGATIVE   WBC, UA 0-5 0 - 5 WBC/hpf   Bacteria, UA NONE SEEN NONE SEEN   Squamous Epithelial / HPF 0-5 0 - 5 /HPF    Comment: Performed at [facility], [address] [place], [place] [address]  CBG monitoring, ED     Status: Abnormal   Collection Time: 10/14/22  1:37 PM  Result Value Ref Range   Glucose-Capillary 213 (H) 70 - 99 mg/dL    Comment: Glucose reference range applies only to samples taken after fasting for at least 8 hours.  CBG monitoring, ED     Status: Abnormal   Collection Time: 10/14/22  4:22 PM  Result Value Ref Range   Glucose-Capillary 224 (H) 70 - 99 mg/dL    Comment: Glucose reference range applies only to samples taken after fasting for at least 8 hours.  Ethanol     Status: None   Collection Time: 10/14/22  9:14 PM  Result Value Ref Range   Alcohol, Ethyl (B) <10 <10 mg/dL    Comment: (NOTE) Lowest detectable limit for serum alcohol is 10 mg/dL.  For medical purposes only. Performed at [facility], [address] [place], [place] [address]   Lipid panel     Status: Abnormal   Collection Time: 10/14/22  9:14 PM  Result Value Ref Range   Cholesterol 204 (H) 0 - 200 mg/dL   Triglycerides 295 <621 mg/dL   HDL 51 >30 mg/dL   Total CHOL/HDL Ratio 4.0 RATIO   VLDL 22 0 - 40 mg/dL   LDL Cholesterol 865 (H) 0 - 99 mg/dL    Comment:        Total Cholesterol/HDL:CHD Risk Coronary Heart Disease Risk Table  Men   Women  1/2 Average Risk   3.4   3.3  Average Risk       5.0   4.4  2 X Average Risk   9.6   7.1  3 X Average Risk  23.4   11.0        Use the calculated Patient Ratio above and the CHD Risk Table to determine the patient's CHD Risk.        ATP III CLASSIFICATION (LDL):  <100     mg/dL   Optimal  295-284  mg/dL   Near or Above                    Optimal  130-159  mg/dL   Borderline  132-440  mg/dL   High  >102     mg/dL    Very High Performed at [facility], [address] [place], [place] [address]   Glucose, capillary     Status: Abnormal   Collection Time: 10/14/22  9:19 PM  Result Value Ref Range   Glucose-Capillary 218 (H) 70 - 99 mg/dL    Comment: Glucose reference range applies only to samples taken after fasting for at least 8 hours.  Lipid panel     Status: Abnormal   Collection Time: 10/15/22  5:16 AM  Result Value Ref Range   Cholesterol 211 (H) 0 - 200 mg/dL   Triglycerides 644 <034 mg/dL   HDL 48 >74 mg/dL   Total CHOL/HDL Ratio 4.4 RATIO   VLDL 29 0 - 40 mg/dL   LDL Cholesterol 259 (H) 0 - 99 mg/dL    Comment:        Total Cholesterol/HDL:CHD Risk Coronary Heart Disease Risk Table                     Men   Women  1/2 Average Risk   3.4   3.3  Average Risk       5.0   4.4  2 X Average Risk   9.6   7.1  3 X Average Risk  23.4   11.0        Use the calculated Patient Ratio above and the CHD Risk Table to determine the patient's CHD Risk.        ATP III CLASSIFICATION (LDL):  <100     mg/dL   Optimal  563-875  mg/dL   Near or Above                    Optimal  130-159  mg/dL   Borderline  643-329  mg/dL   High  >518     mg/dL   Very High Performed at [facility], [address] [place], [place] [address]   HIV Antibody (routine testing w rflx)     Status: None   Collection Time: 10/15/22  5:16 AM  Result Value Ref Range   HIV Screen 4th Generation wRfx Non Reactive Non Reactive    Comment: Performed at [facility], [address] [place], [place] [address]  ECHOCARDIOGRAM COMPLETE     Status: None   Collection Time: 10/15/22  8:11 AM  Result Value Ref Range   BP 165/94 mmHg   Ao pk vel 1.25 m/s   AV Area VTI 2.47 cm2   AR max vel 2.05 cm2   AV Mean grad 3.0 mmHg   AV Peak grad 6.3 mmHg   S' Lateral 2.70 cm  AV Area mean vel 2.04 cm2   Area-P 1/2 2.85 cm2   MV VTI 2.15 cm2   Est EF 60 - 65%   Glucose, capillary     Status: Abnormal    Collection Time: 10/15/22  8:54 AM  Result Value Ref Range   Glucose-Capillary 238 (H) 70 - 99 mg/dL    Comment: Glucose reference range applies only to samples taken after fasting for at least 8 hours.  Microalbumin / creatinine urine ratio     Status: Abnormal   Collection Time: 11/07/22 11:03 AM  Result Value Ref Range   Creatinine, Urine 71 20 - 275 mg/dL   Microalb, Ur 2.4 mg/dL    Comment: Reference Range Not established    Microalb Creat Ratio 34 (H) <30 mg/g creat    Comment: . The ADA defines abnormalities in albumin excretion as follows: Marland Kitchen Albuminuria Category        Result (mg/g creatinine) . Normal to Mildly increased   <30 Moderately increased         30-299  Severely increased           > OR = 300 . The ADA recommends that at least two of three specimens collected within a 3-6 month period be abnormal before considering a patient to be within a diagnostic category.      Fall Risk:    12/19/2022    8:35 AM 11/07/2022    9:23 AM 03/11/2017    9:08 AM 02/06/2017    8:49 AM 01/07/2017    8:59 AM  Fall Risk   Falls in the past year? 1 1 -- -- No  Comment   none since previous visit no falls since previous visit   Number falls in past yr:  0     Injury with Fall? 0 0     Risk for fall due to : History of fall(s);No Fall Risks         Functional Status Survey: Is the patient deaf or have difficulty hearing?: No Does the patient have difficulty seeing, even when wearing glasses/contacts?: No Does the patient have difficulty concentrating, remembering, or making decisions?: No Does the patient have difficulty walking or climbing stairs?: No Does the patient have difficulty dressing or bathing?: No Does the patient have difficulty doing errands alone such as visiting a doctor's office or shopping?: No   Assessment & Plan  1. Annual physical exam -continue to eat well balanced -increase physical activity -labs recently done, f/u appointment in October    -USPSTF grade A and B recommendations reviewed with patient; age-appropriate recommendations, preventive care, screening tests, etc discussed and encouraged; healthy living encouraged; see AVS for patient education given to patient -Discussed importance of 150 minutes of physical activity weekly, eat two servings of fish weekly, eat one serving of tree nuts ( cashews, pistachios, pecans, almonds.Marland Kitchen) every other day, eat 6 servings of fruit/vegetables daily and drink plenty of water and avoid sweet beverages.   -Reviewed Health Maintenance: Yes.

## 2022-12-19 ENCOUNTER — Ambulatory Visit (INDEPENDENT_AMBULATORY_CARE_PROVIDER_SITE_OTHER): Payer: PPO | Admitting: Nurse Practitioner

## 2022-12-19 ENCOUNTER — Encounter: Payer: Self-pay | Admitting: Nurse Practitioner

## 2022-12-19 ENCOUNTER — Other Ambulatory Visit: Payer: Self-pay

## 2022-12-19 VITALS — BP 132/84 | HR 77 | Temp 97.9°F | Resp 16 | Ht <= 58 in | Wt 173.6 lb

## 2022-12-19 DIAGNOSIS — Z0000 Encounter for general adult medical examination without abnormal findings: Secondary | ICD-10-CM | POA: Diagnosis not present

## 2022-12-31 LAB — HM DIABETES EYE EXAM

## 2023-01-01 ENCOUNTER — Other Ambulatory Visit: Payer: Self-pay | Admitting: Nurse Practitioner

## 2023-01-01 NOTE — Telephone Encounter (Signed)
Medication Refill - Medication:  metoprolol succinate (TOPROL-XL) 25 MG 24 hr tablet atorvastatin (LIPITOR) 80 MG tablet clopidogrel (PLAVIX) 75 MG tablet  Has the patient contacted their pharmacy? No.  Preferred Pharmacy (with phone number or street name): [id number] - [place], [place] [address]  Phone: [phone] Fax: [phone]  Has the patient been seen for an appointment in the last year OR does the patient have an upcoming appointment? Yes.    Agent: Please be advised that RX refills may take up to 3 business days. We ask that you follow-up with your pharmacy.

## 2023-01-02 NOTE — Telephone Encounter (Signed)
Requested medication (s) are due for refill today:   Yes for all 3  Requested medication (s) are on the active medication list:   Yes for all 3  Future visit scheduled:   Yes 10/22 with Raynelle Fanning   Last ordered: All 3 10/15/2022 #90, 0 refills  Returned because they were last prescribed by another provider from hospital.   Requested Prescriptions  Pending Prescriptions Disp Refills   metoprolol succinate (TOPROL-XL) 25 MG 24 hr tablet 90 tablet 0    Sig: Take 1 tablet (25 mg total) by mouth daily.     Cardiovascular:  Beta Blockers Passed - 01/02/2023  8:22 AM      Passed - Last BP in normal range    BP Readings from Last 1 Encounters:  12/19/22 132/84         Passed - Last Heart Rate in normal range    Pulse Readings from Last 1 Encounters:  12/19/22 77         Passed - Valid encounter within last 6 months    Recent Outpatient Visits           2 weeks ago Annual physical exam   [facility] Della Goo F, FNP   1 month ago Uncontrolled type 2 diabetes mellitus with hyperglycemia, without long-term current use of insulin [facility])   [facility] Berniece Salines, FNP       Future Appointments             In 1 month Zane Herald, Rudolpho Sevin, FNP [facility], [facility]             atorvastatin (LIPITOR) 80 MG tablet 90 tablet 0    Sig: Take 1 tablet (80 mg total) by mouth daily.     Cardiovascular:  Antilipid - Statins Failed - 01/02/2023  8:22 AM      Failed - Lipid Panel in normal range within the last 12 months    Cholesterol  Date Value Ref Range Status  10/15/2022 211 (H) 0 - 200 mg/dL Final   LDL Cholesterol  Date Value Ref Range Status  10/15/2022 134 (H) 0 - 99 mg/dL Final    Comment:           Total Cholesterol/HDL:CHD Risk Coronary Heart Disease Risk Table                     Men   Women  1/2 Average Risk   3.4   3.3  Average Risk       5.0   4.4  2 X Average Risk   9.6   7.1  3 X  Average Risk  23.4   11.0        Use the calculated Patient Ratio above and the CHD Risk Table to determine the patient's CHD Risk.        ATP III CLASSIFICATION (LDL):  <100     mg/dL   Optimal  244-010  mg/dL   Near or Above                    Optimal  130-159  mg/dL   Borderline  272-536  mg/dL   High  >644     mg/dL   Very High Performed at [facility], [address] [place], [place] [address]    HDL  Date Value Ref Range Status  10/15/2022 48 >40 mg/dL Final   Triglycerides  Date Value Ref Range Status  10/15/2022 147 <150 mg/dL Final         Passed - Patient is not pregnant      Passed - Valid encounter within last 12 months    Recent Outpatient Visits           2 weeks ago Annual physical exam   [facility] Della Goo F, FNP   1 month ago Uncontrolled type 2 diabetes mellitus with hyperglycemia, without long-term current use of insulin [facility])   [facility] Berniece Salines, FNP       Future Appointments             In 1 month Zane Herald, Rudolpho Sevin, FNP [facility], [facility]             clopidogrel (PLAVIX) 75 MG tablet 90 tablet 0    Sig: Take 1 tablet (75 mg total) by mouth daily.     Hematology: Antiplatelets - clopidogrel Passed - 01/02/2023  8:22 AM      Passed - HCT in normal range and within 180 days    HCT  Date Value Ref Range Status  10/14/2022 42.7 36.0 - 46.0 % Final  10/14/2022 43.3 36.0 - 46.0 % Final         Passed - HGB in normal range and within 180 days    Hemoglobin  Date Value Ref Range Status  10/14/2022 14.7 12.0 - 15.0 g/dL Final  40/98/1191 47.8 12.0 - 15.0 g/dL Final         Passed - PLT in normal range and within 180 days    Platelets  Date Value Ref Range Status  10/14/2022 279 150 - 400 K/uL Final  10/14/2022 290 150 - 400 K/uL Final         Passed - Cr in normal range and within 360 days    Creatinine, Ser  Date Value Ref  Range Status  10/14/2022 0.67 0.44 - 1.00 mg/dL Final   Creatinine, Urine  Date Value Ref Range Status  11/07/2022 71 20 - 275 mg/dL Final         Passed - Valid encounter within last 6 months    Recent Outpatient Visits           2 weeks ago Annual physical exam   [facility] Berniece Salines, FNP   1 month ago Uncontrolled type 2 diabetes mellitus with hyperglycemia, without long-term current use of insulin [facility])   [facility] Berniece Salines, FNP       Future Appointments             In 1 month Zane Herald, Rudolpho Sevin, FNP [facility], [facility]

## 2023-01-08 ENCOUNTER — Telehealth: Payer: Self-pay | Admitting: Nurse Practitioner

## 2023-01-08 NOTE — Telephone Encounter (Signed)
Patient called today in a separate encounter for refills on these medications, routing back to practice for provider to review.

## 2023-01-08 NOTE — Telephone Encounter (Signed)
Already sent to the office in the refill encounter on 01/01/23, pending approval from provider, notified of this is 2nd request in that encounter.

## 2023-01-08 NOTE — Telephone Encounter (Signed)
Copied from CRM [id number]. Topic: General - Other >> Jan 08, 2023  3:50 PM Everette C wrote: Reason for CRM: Medication Refill - Medication: metoprolol succinate (TOPROL-XL) 25 MG 24 hr tablet [[id number]]  atorvastatin (LIPITOR) 80 MG tablet [[id number]]  metoprolol succinate (TOPROL-XL) 25 MG 24 hr tablet [[id number]]     Has the patient contacted their pharmacy? Yes.   (Agent: If no, request that the patient contact the pharmacy for the refill. If patient does not wish to contact the pharmacy document the reason why and proceed with request.) (Agent: If yes, when and what did the pharmacy advise?)  Preferred Pharmacy (with phone number or street name): [id number] [place], [place] - [address] [place] [address] Phone: [phone] Fax: [phone] Hours: Not open 24 hours   Has the patient been seen for an appointment in the last year OR does the patient have an upcoming appointment? Yes.    Agent: Please be advised that RX refills may take up to 3 business days. We ask that you follow-up with your pharmacy.

## 2023-01-09 ENCOUNTER — Other Ambulatory Visit: Payer: Self-pay | Admitting: Nurse Practitioner

## 2023-01-09 MED ORDER — METOPROLOL SUCCINATE ER 25 MG PO TB24: 25.0000 mg | ORAL_TABLET | Freq: Every day | ORAL | 3 refills | Status: DC

## 2023-01-09 MED ORDER — CLOPIDOGREL BISULFATE 75 MG PO TABS: 75.0000 mg | ORAL_TABLET | Freq: Every day | ORAL | 3 refills | Status: DC

## 2023-01-09 MED ORDER — ATORVASTATIN CALCIUM 80 MG PO TABS: 80.0000 mg | ORAL_TABLET | Freq: Every day | ORAL | 3 refills | Status: DC

## 2023-01-09 NOTE — Telephone Encounter (Signed)
Medication Refill - Medication: atorvastatin (LIPITOR) 80 MG tablet , clopidogrel (PLAVIX) 75 MG tablet , metoprolol succinate (TOPROL-XL) 25 MG 24 hr tablet   Has the patient contacted their pharmacy? Yes.     Pt has been trying to get this refilled for 2 weeks Preferred Pharmacy (with phone number or street name): [id number] [place], [place] - [address]  Has the patient been seen for an appointment in the last year OR does the patient have an upcoming appointment? Yes.    Pt was prescribed these meds in the hospital, (all are new) and when she saw Raynelle Fanning, they were not due for refill.  Now pt is out of medication and needs asap.

## 2023-01-10 NOTE — Telephone Encounter (Signed)
Medication was refilled 01/10/23 by provider, duplicate requests.  Requested Prescriptions  Pending Prescriptions Disp Refills   atorvastatin (LIPITOR) 80 MG tablet 90 tablet 0    Sig: Take 1 tablet (80 mg total) by mouth daily.     Cardiovascular:  Antilipid - Statins Failed - 01/09/2023 11:14 AM      Failed - Lipid Panel in normal range within the last 12 months    Cholesterol  Date Value Ref Range Status  10/15/2022 211 (H) 0 - 200 mg/dL Final   LDL Cholesterol  Date Value Ref Range Status  10/15/2022 134 (H) 0 - 99 mg/dL Final    Comment:           Total Cholesterol/HDL:CHD Risk Coronary Heart Disease Risk Table                     Men   Women  1/2 Average Risk   3.4   3.3  Average Risk       5.0   4.4  2 X Average Risk   9.6   7.1  3 X Average Risk  23.4   11.0        Use the calculated Patient Ratio above and the CHD Risk Table to determine the patient's CHD Risk.        ATP III CLASSIFICATION (LDL):  <100     mg/dL   Optimal  841-660  mg/dL   Near or Above                    Optimal  130-159  mg/dL   Borderline  630-160  mg/dL   High  >109     mg/dL   Very High Performed at [facility], [address] [place], [place] [address]    HDL  Date Value Ref Range Status  10/15/2022 48 >40 mg/dL Final   Triglycerides  Date Value Ref Range Status  10/15/2022 147 <150 mg/dL Final         Passed - Patient is not pregnant      Passed - Valid encounter within last 12 months    Recent Outpatient Visits           3 weeks ago Annual physical exam   [facility] Della Goo F, FNP   2 months ago Uncontrolled type 2 diabetes mellitus with hyperglycemia, without long-term current use of insulin [facility])   [facility] Berniece Salines, FNP       Future Appointments             In 1 month Zane Herald, Rudolpho Sevin, FNP [facility], [facility]             clopidogrel (PLAVIX) 75 MG tablet  90 tablet 0    Sig: Take 1 tablet (75 mg total) by mouth daily.     Hematology: Antiplatelets - clopidogrel Passed - 01/09/2023 11:14 AM      Passed - HCT in normal range and within 180 days    HCT  Date Value Ref Range Status  10/14/2022 42.7 36.0 - 46.0 % Final  10/14/2022 43.3 36.0 - 46.0 % Final         Passed - HGB in normal range and within 180 days    Hemoglobin  Date Value Ref Range Status  10/14/2022 14.7 12.0 - 15.0 g/dL Final  73/22/0254 27.0 12.0 - 15.0 g/dL Final  Passed - PLT in normal range and within 180 days    Platelets  Date Value Ref Range Status  10/14/2022 279 150 - 400 K/uL Final  10/14/2022 290 150 - 400 K/uL Final         Passed - Cr in normal range and within 360 days    Creatinine, Ser  Date Value Ref Range Status  10/14/2022 0.67 0.44 - 1.00 mg/dL Final   Creatinine, Urine  Date Value Ref Range Status  11/07/2022 71 20 - 275 mg/dL Final         Passed - Valid encounter within last 6 months    Recent Outpatient Visits           3 weeks ago Annual physical exam   [facility] Della Goo F, FNP   2 months ago Uncontrolled type 2 diabetes mellitus with hyperglycemia, without long-term current use of insulin [facility])   [facility] Berniece Salines, FNP       Future Appointments             In 1 month Zane Herald, Rudolpho Sevin, FNP [facility], [facility]             metoprolol succinate (TOPROL-XL) 25 MG 24 hr tablet 90 tablet 0    Sig: Take 1 tablet (25 mg total) by mouth daily.     Cardiovascular:  Beta Blockers Passed - 01/09/2023 11:14 AM      Passed - Last BP in normal range    BP Readings from Last 1 Encounters:  12/19/22 132/84         Passed - Last Heart Rate in normal range    Pulse Readings from Last 1 Encounters:  12/19/22 77         Passed - Valid encounter within last 6 months    Recent Outpatient Visits           3 weeks ago Annual  physical exam   [facility] Della Goo F, FNP   2 months ago Uncontrolled type 2 diabetes mellitus with hyperglycemia, without long-term current use of insulin [facility])   [facility] Berniece Salines, FNP       Future Appointments             In 1 month Zane Herald, Rudolpho Sevin, FNP [facility], [facility]

## 2023-01-24 ENCOUNTER — Other Ambulatory Visit: Payer: Self-pay | Admitting: Nurse Practitioner

## 2023-01-24 DIAGNOSIS — Z1211 Encounter for screening for malignant neoplasm of colon: Secondary | ICD-10-CM

## 2023-01-24 DIAGNOSIS — Z1212 Encounter for screening for malignant neoplasm of rectum: Secondary | ICD-10-CM

## 2023-02-11 ENCOUNTER — Other Ambulatory Visit: Payer: Self-pay

## 2023-02-11 ENCOUNTER — Ambulatory Visit: Payer: PPO | Admitting: Nurse Practitioner

## 2023-02-11 ENCOUNTER — Encounter: Payer: Self-pay | Admitting: Nurse Practitioner

## 2023-02-11 VITALS — BP 132/80 | HR 83 | Temp 97.9°F | Resp 16 | Ht <= 58 in | Wt 174.6 lb

## 2023-02-11 DIAGNOSIS — I10 Essential (primary) hypertension: Secondary | ICD-10-CM | POA: Diagnosis not present

## 2023-02-11 DIAGNOSIS — E1165 Type 2 diabetes mellitus with hyperglycemia: Secondary | ICD-10-CM

## 2023-02-11 DIAGNOSIS — E782 Mixed hyperlipidemia: Secondary | ICD-10-CM | POA: Diagnosis not present

## 2023-02-11 DIAGNOSIS — Z7984 Long term (current) use of oral hypoglycemic drugs: Secondary | ICD-10-CM

## 2023-02-11 DIAGNOSIS — E669 Obesity, unspecified: Secondary | ICD-10-CM

## 2023-02-11 DIAGNOSIS — Z8673 Personal history of transient ischemic attack (TIA), and cerebral infarction without residual deficits: Secondary | ICD-10-CM | POA: Insufficient documentation

## 2023-02-11 MED ORDER — BLOOD GLUCOSE TEST VI STRP: ORAL_STRIP | 3 refills | Status: DC

## 2023-02-11 NOTE — Assessment & Plan Note (Signed)
Currently managed by endocrinology. Continue metformin and glipizide

## 2023-02-11 NOTE — Assessment & Plan Note (Signed)
Working on lifestyle modification, discussed GLP1 medication, she is going to think about it.

## 2023-02-11 NOTE — Assessment & Plan Note (Signed)
Continue metoprolol 25 mg daily.  

## 2023-02-11 NOTE — Assessment & Plan Note (Signed)
Continue atorvastatin 80 mg daily, will get labs at next appointment. She has been following a mediterranean diet.

## 2023-02-11 NOTE — Assessment & Plan Note (Signed)
No changes, doing well. Continue asa and plavix

## 2023-02-11 NOTE — Progress Notes (Signed)
BP 132/80   Pulse 83   Temp 97.9 F (36.6 C) (Oral)   Resp 16   Ht 4\' 10"  (1.473 m)   Wt 174 lb 9.6 oz (79.2 kg)   SpO2 98%   BMI 36.49 kg/m    Subjective:    Patient ID: Kathleen Hickman, female    DOB: [birth year], 67 y.o.   MRN: [id number]  HPI: Kathleen Hickman is a 67 y.o. female  Chief Complaint  Patient presents with   Medical Management of Chronic Issues   Hypertension:  -Medications: metoprolol 25 mg daily -Patient is compliant with above medications and reports no side effects. -Checking BP at home (average): not currently checking -Denies any SOB, CP, vision changes, LE edema or symptoms of hypotension -Diet: recommend DASH diet  -Exercise: recommend 150 min of physical activity weekly      02/11/2023    7:28 AM 12/19/2022    8:30 AM 11/07/2022    9:23 AM  Vitals with BMI  Height 4\' 10"  4\' 10"  4\' 10"   Weight 174 lbs 10 oz 173 lbs 10 oz 171 lbs 13 oz  BMI 36.5 36.29 35.92  Systolic 132 132 409  Diastolic 80 84 84  Pulse 83 77 84     Diabetes, Type 2: -managed by endocrinology last seen on 01/30/2023 -Last A1c 7.2 -Medications: metformin 500 mg BID, glipizide 2.5 mg daily -Patient is compliant with the above medications and reports no side effects.  -Checking BG at home: yes -Fasting home BG: 107 -Diet: reduce sugar and processed foods in your diet  -Exercise: recommend 150 min of physical activity weekly   -Eye exam: utd -Foot exam: utd -Microalbumin: utd -Statin: yes -PNA vaccine: no -Denies symptoms of hypoglycemia, polyuria, polydipsia, numbness extremities, foot ulcers/trauma.    HLD:  -Medications: atorvastatin 80 mg daily -Patient is compliant with above medications and reports no side effects.  -Last lipid panel:  Lipid Panel     Component Value Date/Time   CHOL 211 (H) 10/15/2022 0516   TRIG 147 10/15/2022 0516   HDL 48 10/15/2022 0516   CHOLHDL 4.4 10/15/2022 0516   VLDL 29 10/15/2022 0516   LDLCALC 134 (H) 10/15/2022 0516    Obesity:  Current weight : 174 lbs BMI: 36.49 previous weight:171 lbs Treatment Tried: lifestyle modification Comorbidities: HTN, HLD, DM  Discussed GLP1 medication, she is going to think about it.   Hx of tia: patient was seen by neurology on 11/19/2022. She was instructed to continue asa and plavix. Has follow up appointment with neurology in Feb 2025. Hospital follow up/TIA;  patient was admitted on 10/14/2022 and discharged on 10/15/2022.   They presented from home to the ED on 10/14/2022 with right sided weakness that occurred a couple days prior to presentation to the ED. She was watching TV when it occurred and lasted for several hours. She felt as though her right arm and leg were dead. She presented to the UC with her daughters insistence who then directed her to the ED.    In the ED, it was found that they had stable vital signs with the exception of severely elevated blood pressures with iniital systolic of 202/95.  Significant findings included unremarkable CBC, metabolic panel, and negative orthostatic vitals. CT head was negative acute findings.  Brain MRI also negative for acute abnormality but showing signs of likely chronic small vessel ischemic disease.  CTA head and neck showed 1. 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 More distal M2 MCA branch remains patent. 2. Severe stenosis involving the bilateral P1 and P2 PCAs. 3. Moderate right paraclinoid ICA stenosis.    They were initially treated with aspirin, insulin. Neurology was consulted.     Patient was admitted to medicine service for further workup and management of TIA and hypertensive urgency as outlined in detail below.   10/15/22 -She had resolution of all symptoms prior to admission and completed further workup of her TIA including echo which was unremarkable. She was allowed permissive hypertension for the first 24 hours and  then gradually restarted home blood pressure medications. She was started on plavix and aspirin as well as statin therapy. PT/OT evaluated and did not recommend further therapy needed.   hgb A1c was 9.9. patient received diabetes education and started on metformin and glipizide.     Relevant past medical, surgical, family and social history reviewed and updated as indicated. Interim medical history since our last visit reviewed. Allergies and medications reviewed and updated.  Review of Systems  Constitutional: Negative for fever or weight change.  Respiratory: Negative for cough and shortness of breath.   Cardiovascular: Negative for chest pain or palpitations.  Gastrointestinal: Negative for abdominal pain, no bowel changes.  Musculoskeletal: Negative for gait problem or joint swelling.  Skin: Negative for rash.  Neurological: Negative for dizziness or headache.  No other specific complaints in a complete review of systems (except as listed in HPI above).      Objective:    BP 132/80   Pulse 83   Temp 97.9 F (36.6 C) (Oral)   Resp 16   Ht 4\' 10"  (1.473 m)   Wt 174 lb 9.6 oz (79.2 kg)   SpO2 98%   BMI 36.49 kg/m   Wt Readings from Last 3 Encounters:  02/11/23 174 lb 9.6 oz (79.2 kg)  12/19/22 173 lb 9.6 oz (78.7 kg)  11/07/22 171 lb 12.8 oz (77.9 kg)    Physical Exam  Constitutional: Patient appears well-developed and well-nourished. Obese  No distress.  HEENT: head atraumatic, normocephalic, pupils equal and reactive to light, neck supple, throat within normal limits Cardiovascular: Normal rate, regular rhythm and normal heart sounds.  No murmur heard. No BLE edema. Pulmonary/Chest: Effort normal and breath sounds normal. No respiratory distress. Abdominal: Soft.  There is no tenderness. Neuro:  equal grip, no arm drift, normal gait Psychiatric: Patient has a normal mood and affect. behavior is normal. Judgment and thought content normal.   Results for orders placed  or performed in visit on 01/01/23  HM DIABETES EYE EXAM  Result Value Ref Range   HM Diabetic Eye Exam No Retinopathy No Retinopathy      Assessment & Plan:   Problem List Items Addressed This Visit       Cardiovascular and Mediastinum   Essential hypertension - Primary    Continue metoprolol 25 mg daily        Endocrine   Uncontrolled type 2 diabetes mellitus with hyperglycemia, without long-term current use of insulin (HCC)    Currently managed by endocrinology. Continue metformin and glipizide      Relevant Medications   Glucose Blood (BLOOD GLUCOSE TEST STRIPS) STRP     Other   Obesity (BMI 30-39.9)    Working on lifestyle modification, discussed GLP1 medication, she is going to think about it.       Mixed hyperlipidemia    Continue atorvastatin 80 mg  daily, will get labs at next appointment. She has been following a mediterranean diet.       Hx of transient ischemic attack (TIA)    No changes, doing well. Continue asa and plavix         Follow up plan: Return in about 6 months (around 08/12/2023) for follow up.

## 2023-02-19 IMAGING — MG MM DIGITAL SCREENING BILAT W/ TOMO AND CAD
8 series · 8 of 24 positions shown · non-contrast
Comparison: Previous exam(s).

CLINICAL DATA: Screening.

EXAM:
DIGITAL SCREENING BILATERAL MAMMOGRAM WITH TOMOSYNTHESIS AND CAD
TECHNIQUE: Bilateral screening digital craniocaudal and mediolateral oblique
mammograms were obtained. Bilateral screening digital breast
tomosynthesis was performed. The images were evaluated with
computer-aided detection.

[L MLO synth-2D]
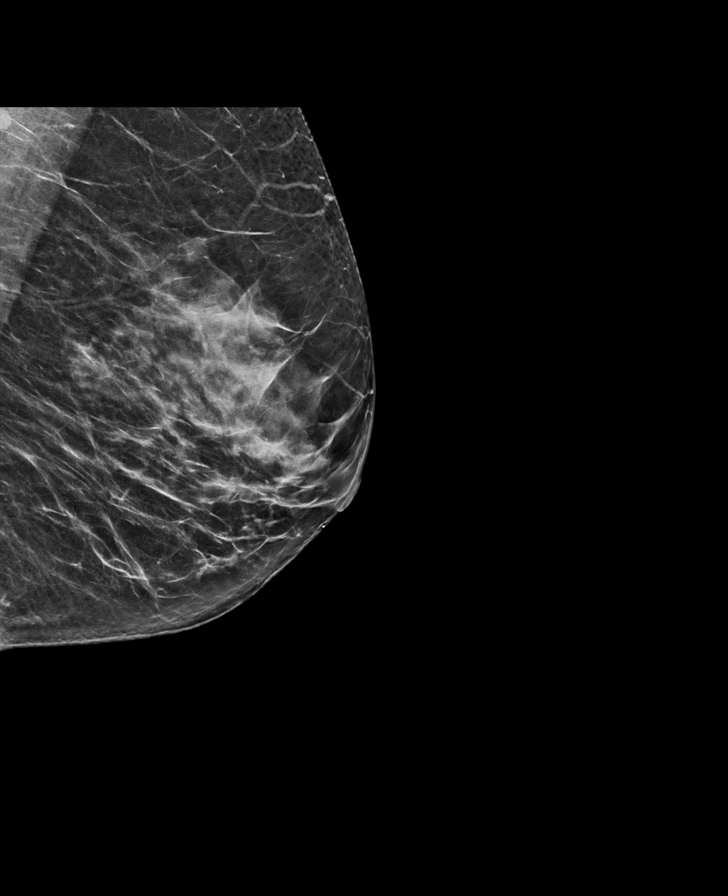

[R MLO synth-2D]
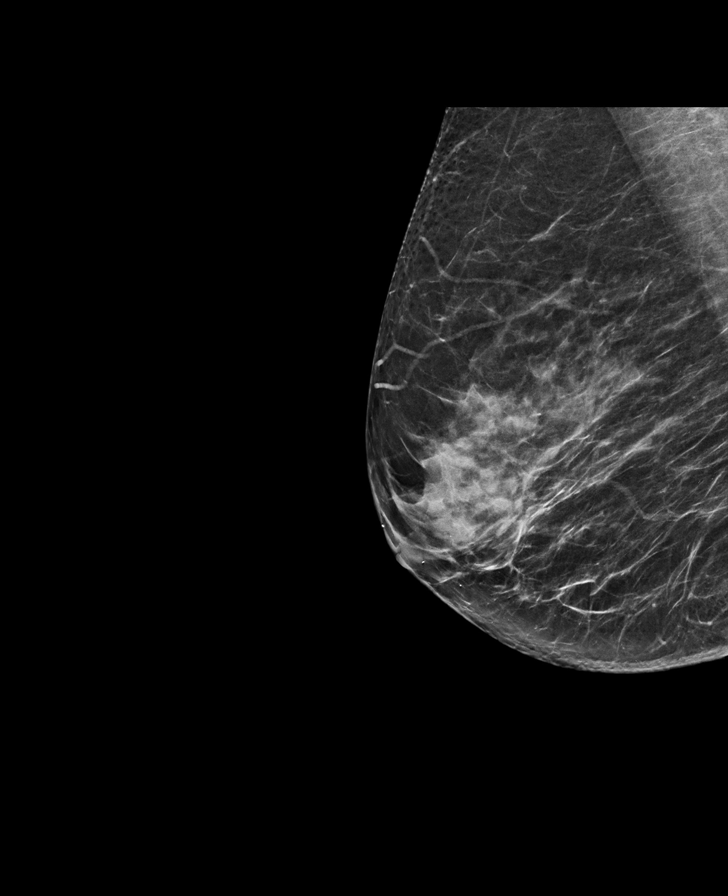

[R CC synth-2D]
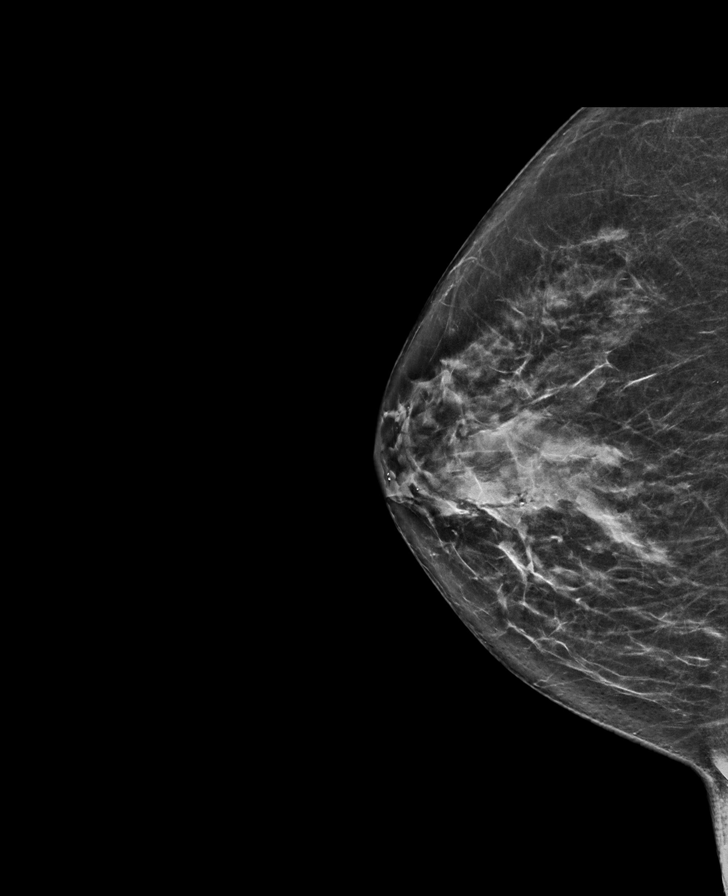

[L CC synth-2D]
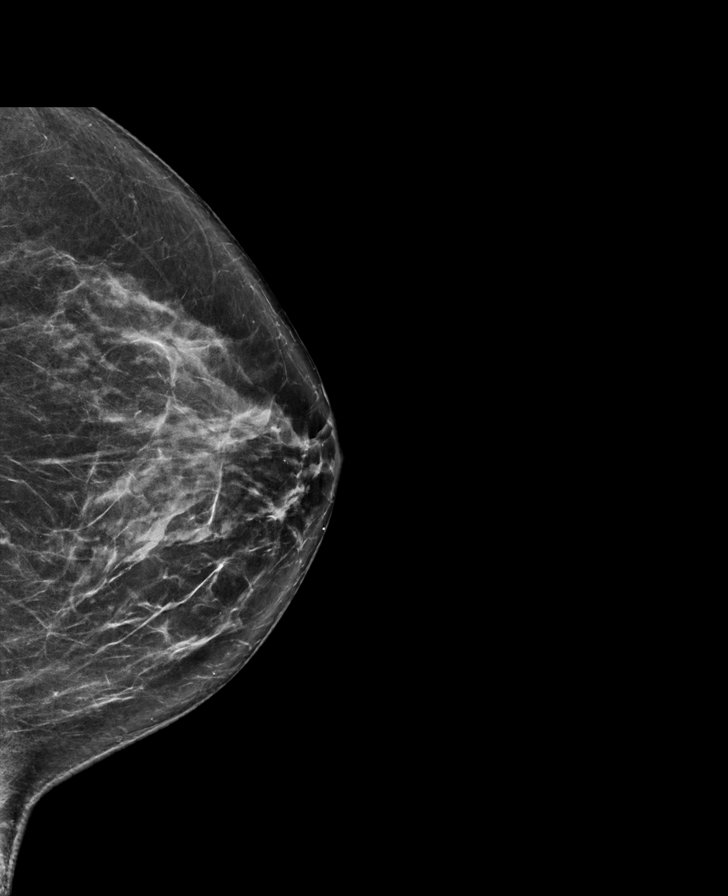

[R CC tomo · tomo slice 35/69.0]
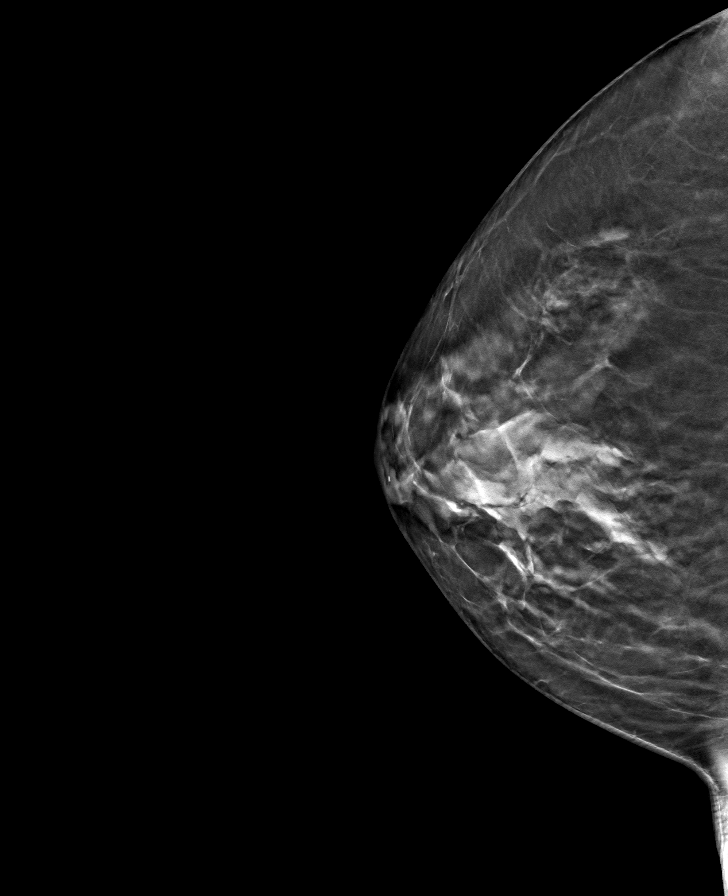

[L CC tomo · tomo slice 37/74.0]
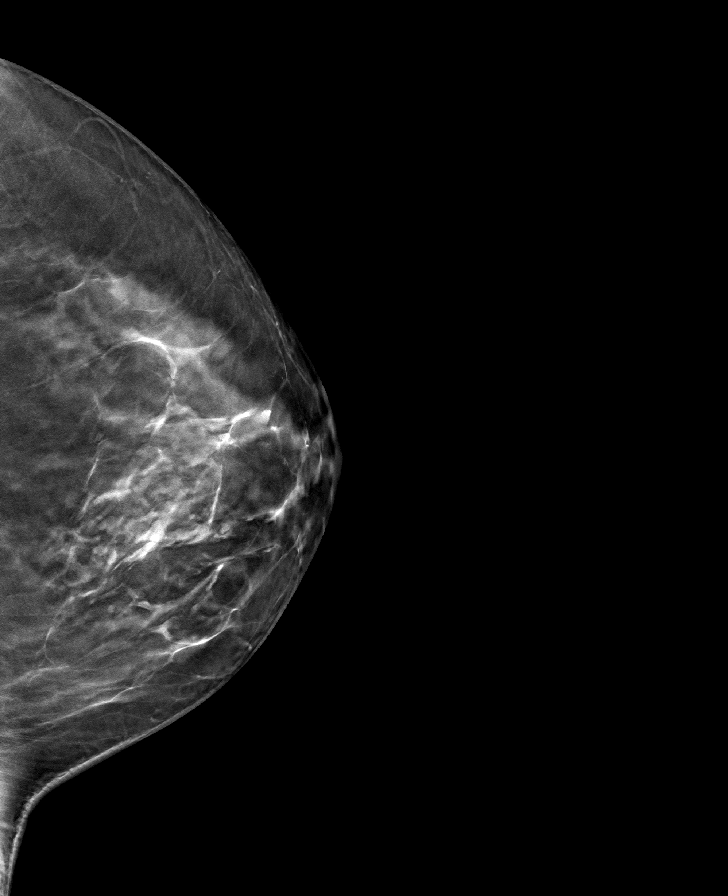

[L MLO tomo · tomo slice 35/68.0]
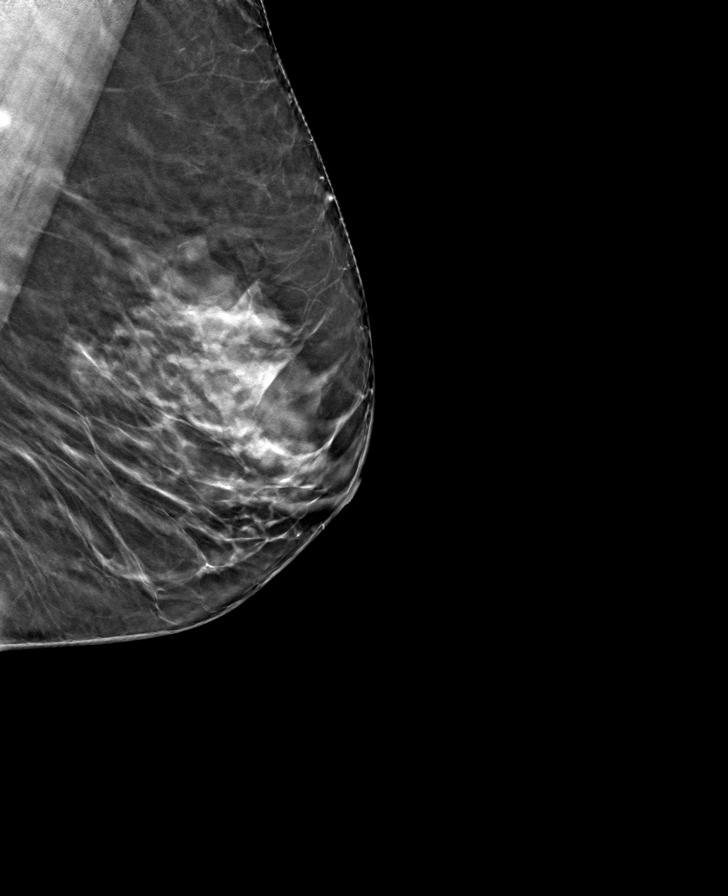

[R MLO tomo · tomo slice 35/68.0]
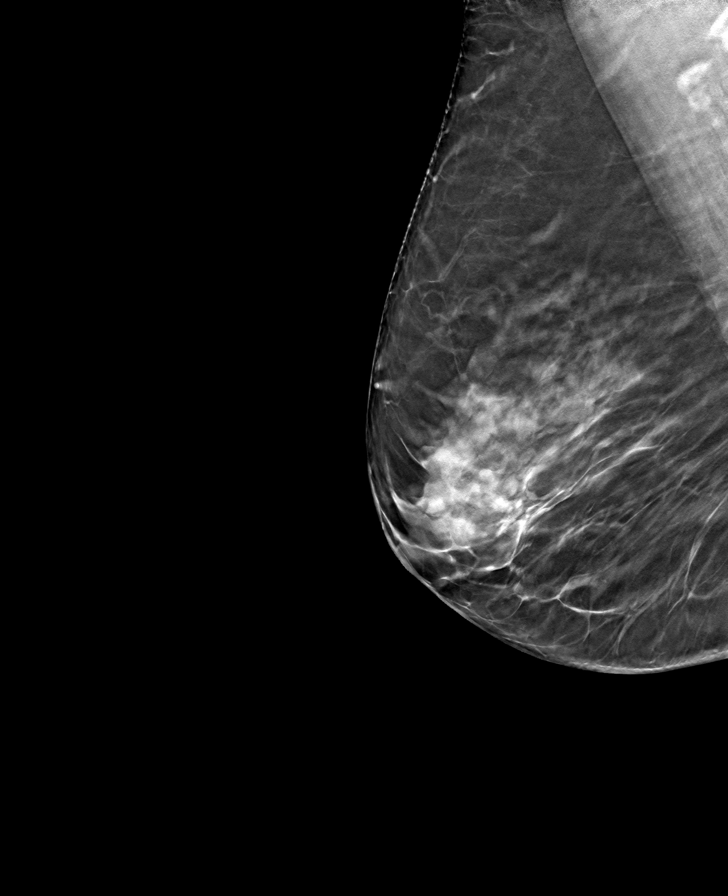

[8 of 24 positions shown; findings below may reference images not displayed]

ACR Breast Density Category c: The breast tissue is heterogeneously
dense, which may obscure small masses.
FINDINGS: There are no findings suspicious for malignancy.
IMPRESSION: No mammographic evidence of malignancy. A result letter of this
screening mammogram will be mailed directly to the patient.

RECOMMENDATION:
Screening mammogram in one year. (Code:Q3-W-BC3)

BI-RADS CATEGORY  1: Negative.

## 2023-02-20 ENCOUNTER — Ambulatory Visit: Payer: Self-pay

## 2023-02-20 NOTE — Telephone Encounter (Signed)
  Chief Complaint: UTI Symptoms: urinary pressure and frequency Frequency: Tuesday  Pertinent Negatives: Patient denies burning  Disposition: [] ED /[] Urgent Care (no appt availability in office) / [] Appointment(In office/virtual)/ []  [place] [facility]/ [] Home Care/ [] Refused Recommended Disposition /[] [place] [facility]/ [x]  Follow-up with PCP Additional Notes: pt wanting to come give urine sample for urinary sx. Pt has increased water intake but not helping. Offered appt today at 1120 but pt needed after 1pm. Offered appt for tomorrow but pt has other appts already scheduled. Pt wanting to see if she can just come by and do UA and PCP FU with her that way. Advised I would send message back and they can FU with her. Pt verbalized understanding.    Summary: possible UTI   Patient called stated she is experiencing pressure and frequent bathroom uses. She is not sure if its a UTI or not but need to come by to do a urine test. Please f/u with patient         Reason for Disposition  Urinating more frequently than usual (i.e., frequency)  Answer Assessment - Initial Assessment Questions 1. SYMPTOM: "What's the main symptom you're concerned about?" (e.g., frequency, incontinence)     Pressure and frequency  2. ONSET: "When did the  sx  start?"     Tuesday  3. PAIN: "Is there any pain?" If Yes, ask: "How bad is it?" (Scale: 1-10; mild, moderate, severe)     no 4. CAUSE: "What do you think is causing the symptoms?"     Possible UTI 5. OTHER SYMPTOMS: "Do you have any other symptoms?" (e.g., blood in urine, fever, flank pain, pain with urination)     no  Protocols used: Urinary Symptoms-A-AH

## 2023-02-20 NOTE — Telephone Encounter (Signed)
Spoke with pt and informed her that she would need an appointment. Raynelle Fanning is completely booked for this afternoon, so I offered her Dr Caralee Ates. Pt declined appt and said she will call back on Monday if its no better.

## 2023-02-27 ENCOUNTER — Encounter: Payer: Self-pay | Admitting: Physician Assistant

## 2023-02-27 ENCOUNTER — Ambulatory Visit: Payer: PPO | Admitting: Physician Assistant

## 2023-02-27 ENCOUNTER — Ambulatory Visit: Payer: Self-pay

## 2023-02-27 VITALS — BP 168/84 | HR 81 | Resp 16 | Ht <= 58 in | Wt 176.0 lb

## 2023-02-27 DIAGNOSIS — I10 Essential (primary) hypertension: Secondary | ICD-10-CM | POA: Diagnosis not present

## 2023-02-27 MED ORDER — LISINOPRIL 5 MG PO TABS: 5.0000 mg | ORAL_TABLET | Freq: Every day | ORAL | 1 refills | Status: DC

## 2023-02-27 NOTE — Progress Notes (Signed)
Acute Office Visit   Patient: Kathleen Hickman   DOB: Jan 21, 1956   67 y.o. Female  MRN: [id number] Visit Date: 02/27/2023  Today's healthcare provider: Oswaldo Conroy Jayr Lupercio, PA-C  Introduced myself to the patient as a Secondary school teacher and provided education on APPs in clinical practice.    Chief Complaint  Patient presents with   Hypertension    Has been running high at home since Saturday average 190s/90s   Subjective    HPI HPI     Hypertension    Additional comments: Has been running high at home since Saturday average 190s/90s      Last edited by Forde Radon, CMA on 02/27/2023  1:28 PM.       Elevated BP   Onset: sudden  Duration: ongoing since Sat  She has been checking BP BID since Sat - they have all been elevated  States she has been feeling funny - checked her glucose levels which were normal   She denies increased stress lately  She has not changed her diet and is exercising 4 times per week   She is currently taking Metoprolol 25 mg PO every day    Medications: Outpatient Medications Prior to Visit  Medication Sig   atorvastatin (LIPITOR) 80 MG tablet Take 1 tablet (80 mg total) by mouth daily.   Blood Glucose Monitoring Suppl (ONETOUCH VERIO REFLECT) w/Device KIT    clopidogrel (PLAVIX) 75 MG tablet Take 1 tablet (75 mg total) by mouth daily.   Glucose Blood (BLOOD GLUCOSE TEST STRIPS) STRP Use as directed to monitor FSBS once daily for DM   metFORMIN (GLUCOPHAGE-XR) 500 MG 24 hr tablet Take 1 tablet (500 mg total) by mouth 2 (two) times daily with a meal.   metoprolol succinate (TOPROL-XL) 25 MG 24 hr tablet Take 1 tablet (25 mg total) by mouth daily.   glipiZIDE (GLUCOTROL XL) 2.5 MG 24 hr tablet Take 1 tablet (2.5 mg total) by mouth daily with breakfast.   No facility-administered medications prior to visit.    Review of Systems  Neurological:  Negative for dizziness, light-headedness and headaches.        Objective    BP (!) 168/84    Pulse 81   Resp 16   Ht 4\' 10"  (1.473 m)   Wt 176 lb (79.8 kg)   SpO2 96%   BMI 36.78 kg/m     Physical Exam Vitals reviewed.  Constitutional:      General: She is awake.     Appearance: Normal appearance. She is well-developed and well-groomed.  HENT:     Head: Normocephalic and atraumatic.  Cardiovascular:     Rate and Rhythm: Normal rate and regular rhythm.     Pulses: Normal pulses.          Radial pulses are 2+ on the right side and 2+ on the left side.     Heart sounds: Normal heart sounds. No murmur heard.    No friction rub. No gallop.  Pulmonary:     Effort: Pulmonary effort is normal.     Breath sounds: Normal breath sounds. No decreased air movement. No decreased breath sounds, wheezing, rhonchi or rales.  Musculoskeletal:     Cervical back: Normal range of motion.     Right lower leg: No edema.     Left lower leg: No edema.  Neurological:     General: No focal deficit present.     Mental Status: She is  alert and oriented to person, place, and time. Mental status is at baseline.     GCS: GCS eye subscore is 4. GCS verbal subscore is 5. GCS motor subscore is 6.  Psychiatric:        Attention and Perception: Attention and perception normal.        Mood and Affect: Mood and affect normal.        Speech: Speech normal.        Behavior: Behavior normal. Behavior is cooperative.        Thought Content: Thought content normal.        Cognition and Memory: Cognition normal.       No results found for any visits on 02/27/23.  Assessment & Plan      Return in about 4 weeks (around 03/27/2023) for HTN.      Problem List Items Addressed This Visit       Cardiovascular and Mediastinum   Essential hypertension - Primary    Chronic, historic condition Appears exacerbated at this time Patient reports high blood pressures at home and BP in office is also elevated She is currently taking metoprolol 25 mg p.o. daily Will add lisinopril 5 mg p.o. daily in addition  to metoprolol for further management Recommend that she checks blood pressure at home and keep a log for monitoring Follow-up in 4 weeks or sooner if concerns arise      Relevant Medications   lisinopril (ZESTRIL) 5 MG tablet     Return in about 4 weeks (around 03/27/2023) for HTN.   I, Dylin Breeden E Baillie Mohammad, PA-C, have reviewed all documentation for this visit. The documentation on 02/27/23 for the exam, diagnosis, procedures, and orders are all accurate and complete.   Jacquelin Hawking, MHS, PA-C [facility]

## 2023-02-27 NOTE — Telephone Encounter (Signed)
     Chief Complaint: Elevated BP."I noticed it last week." Today 180/76. Symptoms: No symptoms Frequency: 1 week Pertinent Negatives: Patient denies any symptoms Disposition: [] ED /[] Urgent Care (no appt availability in office) / [x] Appointment(In office/virtual)/ []  [facility]/ [] Home Care/ [] Refused Recommended Disposition /[] [facility]/ []  Follow-up with PCP Additional Notes: Pt. Agrees with appointment.  Reason for Disposition  Systolic BP  >= 180 OR Diastolic >= 110  Answer Assessment - Initial Assessment Questions 1. BLOOD PRESSURE: "What is the blood pressure?" "Did you take at least two measurements 5 minutes apart?"     180/76 2. ONSET: "When did you take your blood pressure?"     Today - started last week 3. HOW: "How did you take your blood pressure?" (e.g., automatic home BP monitor, visiting nurse)     Home cuff 4. HISTORY: "Do you have a history of high blood pressure?"     Yes 5. MEDICINES: "Are you taking any medicines for blood pressure?" "Have you missed any doses recently?"     Yes 6. OTHER SYMPTOMS: "Do you have any symptoms?" (e.g., blurred vision, chest pain, difficulty breathing, headache, weakness)     No 7. PREGNANCY: "Is there any chance you are pregnant?" "When was your last menstrual period?"     No  Protocols used: Blood Pressure - High-A-AH

## 2023-02-27 NOTE — Patient Instructions (Addendum)
Your blood pressure was elevated today.  I think it would be prudent to start a medication for this called Lisinopril 5 mg to be taken by mouth once per day.   If possible please take it at home using an electronic blood pressure cuff for the upper arm Record your blood pressure once per day and bring them back with you to your apt so we can make sure you are not developing high blood pressure.   Incorporating a minimum of 150 minutes (20-30 minutes per day) of moderate intensity physical activity can help improve your heart health and reduce the chances of high blood pressure and other cardiovascular risks. Incorporating a heart healthy diet can also help reduce the chances of heart attack and high cholesterol.  It was nice to meet you and I appreciate the opportunity to be involved in your care If you were satisfied with the care you received from me, I would greatly appreciate you saying so in the after-visit survey that is sent out following our visit.

## 2023-02-27 NOTE — Assessment & Plan Note (Signed)
Chronic, historic condition Appears exacerbated at this time Patient reports high blood pressures at home and BP in office is also elevated She is currently taking metoprolol 25 mg p.o. daily Will add lisinopril 5 mg p.o. daily in addition to metoprolol for further management Recommend that she checks blood pressure at home and keep a log for monitoring Follow-up in 4 weeks or sooner if concerns arise

## 2023-02-28 ENCOUNTER — Ambulatory Visit: Payer: PPO | Admitting: Nurse Practitioner

## 2023-03-10 ENCOUNTER — Ambulatory Visit: Payer: PPO | Admitting: Nurse Practitioner

## 2023-03-12 ENCOUNTER — Other Ambulatory Visit: Payer: Self-pay | Admitting: Nurse Practitioner

## 2023-03-12 DIAGNOSIS — E1165 Type 2 diabetes mellitus with hyperglycemia: Secondary | ICD-10-CM

## 2023-03-13 ENCOUNTER — Encounter: Payer: Self-pay | Admitting: Nurse Practitioner

## 2023-03-13 ENCOUNTER — Telehealth: Payer: PPO | Admitting: Nurse Practitioner

## 2023-03-13 ENCOUNTER — Ambulatory Visit: Payer: Self-pay | Admitting: *Deleted

## 2023-03-13 ENCOUNTER — Other Ambulatory Visit: Payer: Self-pay

## 2023-03-13 DIAGNOSIS — J309 Allergic rhinitis, unspecified: Secondary | ICD-10-CM

## 2023-03-13 MED ORDER — FLUTICASONE PROPIONATE 50 MCG/ACT NA SUSP: 2.0000 | Freq: Every day | NASAL | 6 refills | Status: DC

## 2023-03-13 NOTE — Telephone Encounter (Signed)
Requested Prescriptions  Refused Prescriptions Disp Refills   metFORMIN (GLUCOPHAGE-XR) 500 MG 24 hr tablet [Pharmacy Med Name: METFORMIN HCL ER 500 MG TABLET] 180 tablet 1    Sig: TAKE 1 TABLET BY MOUTH 2 TIMES DAILY WITH A MEAL.     Endocrinology:  Diabetes - Biguanides Failed - 03/12/2023  4:29 PM      Failed - HBA1C is between 0 and 7.9 and within 180 days    Hgb A1c MFr Bld  Date Value Ref Range Status  10/14/2022 9.9 (H) 4.8 - 5.6 % Final    Comment:    (NOTE) Pre diabetes:          5.7%-6.4%  Diabetes:              >6.4%  Glycemic control for   <7.0% adults with diabetes          Failed - B12 Level in normal range and within 720 days    No results found for: "VITAMINB12"       Passed - Cr in normal range and within 360 days    Creatinine, Ser  Date Value Ref Range Status  10/14/2022 0.67 0.44 - 1.00 mg/dL Final   Creatinine, Urine  Date Value Ref Range Status  11/07/2022 71 20 - 275 mg/dL Final         Passed - eGFR in normal range and within 360 days    GFR calc Af Amer  Date Value Ref Range Status  05/05/2009  >60 mL/min Final   >60        The eGFR has been calculated using the MDRD equation. This calculation has not been validated in all clinical situations. eGFR's persistently <60 mL/min signify possible Chronic Kidney Disease.   GFR, Estimated  Date Value Ref Range Status  10/14/2022 >60 >60 mL/min Final    Comment:    (NOTE) Calculated using the CKD-EPI Creatinine Equation (2021)          Passed - Valid encounter within last 6 months    Recent Outpatient Visits           Today Allergic sinusitis   [facility] Berniece Salines, FNP   2 weeks ago Essential hypertension   [facility] Mecum, Oswaldo Conroy, PA-C   1 month ago Essential hypertension   [facility] Berniece Salines, FNP   2 months ago Annual physical exam   [facility] Della Goo F, FNP   4 months ago Uncontrolled type 2 diabetes mellitus with hyperglycemia, without long-term current use of insulin [facility])   [facility] Berniece Salines, FNP       Future Appointments             In 2 weeks Zane Herald Rudolpho Sevin, FNP [facility]   In 5 months Berniece Salines, FNP [facility]            Passed - CBC within normal limits and completed in the last 12 months    WBC  Date Value Ref Range Status  10/14/2022 6.3 4.0 - 10.5 K/uL Final  10/14/2022 6.3 4.0 - 10.5 K/uL Final   RBC  Date Value Ref Range Status  10/14/2022 4.69 3.87 - 5.11 MIL/uL Final  10/14/2022 4.74 3.87 - 5.11 MIL/uL Final   Hemoglobin  Date Value Ref Range Status  10/14/2022 14.7 12.0 -  15.0 g/dL Final  28/41/3244 01.0 12.0 - 15.0 g/dL Final   HCT  Date Value Ref Range Status  10/14/2022 42.7 36.0 - 46.0 % Final  10/14/2022 43.3 36.0 - 46.0 % Final   MCHC  Date Value Ref Range Status  10/14/2022 34.4 30.0 - 36.0 g/dL Final  27/25/3664 40.3 30.0 - 36.0 g/dL Final   [facility]  Date Value Ref Range Status  10/14/2022 31.3 26.0 - 34.0 pg Final  10/14/2022 31.4 26.0 - 34.0 pg Final   MCV  Date Value Ref Range Status  10/14/2022 91.0 80.0 - 100.0 fL Final  10/14/2022 91.4 80.0 - 100.0 fL Final   No results found for: "PLTCOUNTKUC", "LABPLAT", "POCPLA" RDW  Date Value Ref Range Status  10/14/2022 12.2 11.5 - 15.5 % Final  10/14/2022 12.2 11.5 - 15.5 % Final

## 2023-03-13 NOTE — Telephone Encounter (Signed)
Attempted to return her call.   Left a voicemail to call back.   Any nurse would be glad to assist her.

## 2023-03-13 NOTE — Telephone Encounter (Signed)
Message from [place] M sent at 03/13/2023  7:40 AM EST  Summary: runny nose burning sensation in her nose and runny eyes.   Pt stated she is experiencing a runny nose burning sensation in her nose and runny eyes. Pt is requesting Flonase be called in for her; she stated she is a fairly new pt of Julie's.  Declined appointment is asking for Rx to be called in if possible.  Seeking clinical advice.          Call History  Contact Date/Time Type Contact Phone/Fax User  03/13/2023 07:35 AM EST Phone (Incoming) Kathleen Hickman, Kathleen Hickman (Self) [phone] (H) McGill, Darlina Rumpf

## 2023-03-13 NOTE — Progress Notes (Signed)
Name: Kathleen Hickman   MRN: [id number]    DOB: January 17, 1956   Date:03/13/2023       Progress Note  Subjective  Chief Complaint  Chief Complaint  Patient presents with   URI    Runny nose clear for 1 week    I connected with  Kathleen Hickman  on 03/13/23 at  9:20 AM EST by a video enabled telemedicine application and verified that I am speaking with the correct person using two identifiers.  I discussed the limitations of evaluation and management by telemedicine and the availability of in person appointments. The patient expressed understanding and agreed to proceed with a virtual visit  Staff also discussed with the patient that there may be a patient responsible charge related to this service. Patient Location: home Provider Location: [facility] Additional Individuals present: alone  HPI  Discussed the use of AI scribe software for clinical note transcription with the patient, who gave verbal consent to proceed.  History of Present Illness   The patient presents with a clear, runny nose and watery eyes that started after a recent surgery. They deny fever and cough. The symptoms occur about twice a year and are typically managed with Flonase, which effectively clears up the symptoms.    Patient Active Problem List   Diagnosis Date Noted   Hx of transient ischemic attack (TIA) 02/11/2023   Mixed hyperlipidemia 11/07/2022   Obesity (BMI 30-39.9) 10/14/2022   Uncontrolled type 2 diabetes mellitus with hyperglycemia, without long-term current use of insulin (HCC) 10/14/2022   Essential hypertension 10/14/2022    Social History   Tobacco Use   Smoking status: Never    Passive exposure: Never   Smokeless tobacco: Never  Substance Use Topics   Alcohol use: Not Currently    Alcohol/week: 2.0 standard drinks of alcohol    Types: 2 Cans of beer per week     Current Outpatient Medications:    atorvastatin (LIPITOR) 80 MG tablet, Take 1 tablet (80 mg total) by mouth daily., Disp: 90  tablet, Rfl: 3   clopidogrel (PLAVIX) 75 MG tablet, Take 1 tablet (75 mg total) by mouth daily., Disp: 90 tablet, Rfl: 3   fluticasone (FLONASE) 50 MCG/ACT nasal spray, Place 2 sprays into both nostrils daily., Disp: 16 g, Rfl: 6   Glucose Blood (BLOOD GLUCOSE TEST STRIPS) STRP, Use as directed to monitor FSBS once daily for DM, Disp: 100 strip, Rfl: 3   lisinopril (ZESTRIL) 5 MG tablet, Take 1 tablet (5 mg total) by mouth daily., Disp: 30 tablet, Rfl: 1   metFORMIN (GLUCOPHAGE-XR) 500 MG 24 hr tablet, Take 1 tablet (500 mg total) by mouth 2 (two) times daily with a meal., Disp: 180 tablet, Rfl: 1   metoprolol succinate (TOPROL-XL) 25 MG 24 hr tablet, Take 1 tablet (25 mg total) by mouth daily., Disp: 90 tablet, Rfl: 3   Blood Glucose Monitoring Suppl (ONETOUCH VERIO REFLECT) w/Device KIT, , Disp: , Rfl:    glipiZIDE (GLUCOTROL XL) 2.5 MG 24 hr tablet, Take 1 tablet (2.5 mg total) by mouth daily with breakfast., Disp: 90 tablet, Rfl: 0  Allergies  Allergen Reactions   Morphine And Codeine Anaphylaxis   Aspirin Nausea Only    Pt states can take enteric coated low-dose aspirin.   Ciprofloxacin Hives   Codeine Other (See Comments)    Thrush in the mouth   Elemental Sulfur Hives   Influenza Vaccines Rash    Knot at injection site   Penicillin G  Rash    I personally reviewed active problem list, medication list, allergies, notes from last encounter with the patient/caregiver today.  ROS  Ten systems reviewed and is negative except as mentioned in HPI    Objective  Virtual encounter, vitals not obtained.  There is no height or weight on file to calculate BMI.  Nursing Note and Vital Signs reviewed.  Physical Exam  Awake, alert and oriented, speaking in complete sentences  No results found for this or any previous visit (from the past 72 hour(s)).  Assessment & Plan  Assessment and Plan    Allergic Rhinitis Clear rhinorrhea and itchy eyes started after recent surgery. No  fever or cough. Flonase has been effective in the past. -Send prescription for Fl[facility] to [facility] [address].         -Red flags and when to present for emergency care or RTC including fever >101.70F, chest pain, shortness of breath, new/worsening/un-resolving symptoms,  reviewed with patient at time of visit. Follow up and care instructions discussed and provided in AVS. - I discussed the assessment and treatment plan with the patient. The patient was provided an opportunity to ask questions and all were answered. The patient agreed with the plan and demonstrated an understanding of the instructions.  I provided 15 minutes of non-face-to-face time during this encounter.  Berniece Salines, FNP

## 2023-03-13 NOTE — Telephone Encounter (Signed)
     Chief Complaint: Pt. States "I have a cold." Asking for Flonase to be called in so "my insurance will pay for it." Symptoms: Runny nose with clear mucus Frequency: 1 week Pertinent Negatives: Patient denies fever Disposition: [] ED /[] Urgent Care (no appt availability in office) / [] Appointment(In office/virtual)/ []  [place] [facility]/ [x] Home Care/ [] Refused Recommended Disposition /[] [facility]/ [x]  Follow-up with PCP Additional Notes: Please advise pt.  Reason for Disposition  [1] Sinus congestion as part of a cold AND [2] present < 10 days  Answer Assessment - Initial Assessment Questions 1. LOCATION: "Where does it hurt?"      No pain 2. ONSET: "When did the sinus pain start?"  (e.g., hours, days)      1 week ago 3. SEVERITY: "How bad is the pain?"   (Scale 1-10; mild, moderate or severe)   - MILD (1-3): doesn't interfere with normal activities    - MODERATE (4-7): interferes with normal activities (e.g., work or school) or awakens from sleep   - SEVERE (8-10): excruciating pain and patient unable to do any normal activities        None 4. RECURRENT SYMPTOM: "Have you ever had sinus problems before?" If Yes, ask: "When was the last time?" and "What happened that time?"      Yes 5. NASAL CONGESTION: "Is the nose blocked?" If Yes, ask: "Can you open it or must you breathe through your mouth?"     No 6. NASAL DISCHARGE: "Do you have discharge from your nose?" If so ask, "What color?"     Clear 7. FEVER: "Do you have a fever?" If Yes, ask: "What is it, how was it measured, and when did it start?"      No 8. OTHER SYMPTOMS: "Do you have any other symptoms?" (e.g., sore throat, cough, earache, difficulty breathing)     Watery eyes 9. PREGNANCY: "Is there any chance you are pregnant?" "When was your last menstrual period?"     No  Protocols used: Sinus Pain or Congestion-A-AH

## 2023-03-13 NOTE — Telephone Encounter (Signed)
Pt has a virtual scheduled with Raynelle Fanning this morning

## 2023-03-22 ENCOUNTER — Other Ambulatory Visit: Payer: Self-pay | Admitting: Physician Assistant

## 2023-03-22 DIAGNOSIS — I10 Essential (primary) hypertension: Secondary | ICD-10-CM

## 2023-03-25 NOTE — Telephone Encounter (Signed)
Requested medication (s) are due for refill today: see below  Requested medication (s) are on the active medication list: yes  Last refill:  02/27/23 #30/1  Future visit scheduled: yes  Notes to clinic:  Pharmacy comment: REQUEST FOR 90 DAYS PRESCRIPTION. DX Code Needed.      Requested Prescriptions  Pending Prescriptions Disp Refills   lisinopril (ZESTRIL) 5 MG tablet [Pharmacy Med Name: LISINOPRIL 5 MG TABLET] 90 tablet 1    Sig: Take 1 tablet (5 mg total) by mouth daily.     Cardiovascular:  ACE Inhibitors Failed - 03/22/2023  1:30 PM      Failed - Last BP in normal range    BP Readings from Last 1 Encounters:  02/27/23 (!) 168/84         Passed - Cr in normal range and within 180 days    Creatinine, Ser  Date Value Ref Range Status  10/14/2022 0.67 0.44 - 1.00 mg/dL Final   Creatinine, Urine  Date Value Ref Range Status  11/07/2022 71 20 - 275 mg/dL Final         Passed - K in normal range and within 180 days    Potassium  Date Value Ref Range Status  10/14/2022 3.7 3.5 - 5.1 mmol/L Final         Passed - Patient is not pregnant      Passed - Valid encounter within last 6 months    Recent Outpatient Visits           1 week ago Allergic sinusitis   [facility] Berniece Salines, FNP   3 weeks ago Essential hypertension   [facility] Mecum, Oswaldo Conroy, PA-C   1 month ago Essential hypertension   [facility] Berniece Salines, FNP   3 months ago Annual physical exam   [facility] Della Goo F, FNP   4 months ago Uncontrolled type 2 diabetes mellitus with hyperglycemia, without long-term current use of insulin [facility])   [facility] Berniece Salines, FNP       Future Appointments             In 2 days Zane Herald, Rudolpho Sevin, FNP [facility], [facility]   In 4 months Zane Herald, Rudolpho Sevin, FNP [facility]

## 2023-03-26 ENCOUNTER — Other Ambulatory Visit: Payer: Self-pay

## 2023-03-26 DIAGNOSIS — I10 Essential (primary) hypertension: Secondary | ICD-10-CM

## 2023-03-26 MED ORDER — LISINOPRIL 5 MG PO TABS
5.0000 mg | ORAL_TABLET | Freq: Every day | ORAL | 1 refills | Status: DC
Start: 2023-03-26 — End: 2023-03-27

## 2023-03-27 ENCOUNTER — Encounter: Payer: Self-pay | Admitting: Nurse Practitioner

## 2023-03-27 ENCOUNTER — Ambulatory Visit (INDEPENDENT_AMBULATORY_CARE_PROVIDER_SITE_OTHER): Payer: PPO | Admitting: Nurse Practitioner

## 2023-03-27 ENCOUNTER — Ambulatory Visit: Payer: PPO | Admitting: Nurse Practitioner

## 2023-03-27 VITALS — BP 160/88 | HR 84 | Temp 98.0°F | Resp 16 | Ht <= 58 in | Wt 174.1 lb

## 2023-03-27 DIAGNOSIS — I10 Essential (primary) hypertension: Secondary | ICD-10-CM | POA: Diagnosis not present

## 2023-03-27 MED ORDER — LISINOPRIL 10 MG PO TABS
10.0000 mg | ORAL_TABLET | Freq: Every day | ORAL | Status: DC
Start: 2023-03-27 — End: 2023-05-02

## 2023-03-27 NOTE — Progress Notes (Signed)
BP (!) 160/88   Pulse 84   Temp 98 F (36.7 C) (Oral)   Resp 16   Ht 4\' 10"  (1.473 m) Comment: per patient  Wt 174 lb 1.6 oz (79 kg)   SpO2 98%   BMI 36.39 kg/m    Subjective:    Patient ID: Kathleen Hickman, female    DOB: 10/30/1955, 67 y.o.   MRN: [id number]  HPI: Kathleen Hickman is a 67 y.o. female  Chief Complaint  Patient presents with   Hypertension    Discussed the use of AI scribe software for clinical note transcription with the patient, who gave verbal consent to proceed.  History of Present Illness   The patient, with a history of hypertension and diabetes, presents for a follow-up visit due to persistently high blood pressure despite current medication regimen of metoprolol 25mg  daily and lisinopril 5mg  daily. The patient reports blood pressure readings ranging from 154/82 to 192/96, with the highest reading of 172/97 taken in the morning upon waking. The patient denies experiencing any headaches or other symptoms associated with high blood pressure.  The patient expresses concern about the potential side effects of lisinopril, particularly kidney disease and vision problems, which caused initial hesitation in starting the medication. The patient reports no current side effects from the medication. The patient also mentions a history of diabetes, which is currently under control. The patient is active, exercising four days a week and working five days a week, and reports a healthy diet with minimal sweets.   Relevant past medical, surgical, family and social history reviewed and updated as indicated. Interim medical history since our last visit reviewed. Allergies and medications reviewed and updated.  Review of Systems  Constitutional: Negative for fever or weight change.  Respiratory: Negative for cough and shortness of breath.   Cardiovascular: Negative for chest pain or palpitations.  Gastrointestinal: Negative for abdominal pain, no bowel changes.   Musculoskeletal: Negative for gait problem or joint swelling.  Skin: Negative for rash.  Neurological: Negative for dizziness or headache.  No other specific complaints in a complete review of systems (except as listed in HPI above).      Objective:    BP (!) 160/88   Pulse 84   Temp 98 F (36.7 C) (Oral)   Resp 16   Ht 4\' 10"  (1.473 m) Comment: per patient  Wt 174 lb 1.6 oz (79 kg)   SpO2 98%   BMI 36.39 kg/m   Wt Readings from Last 3 Encounters:  03/27/23 174 lb 1.6 oz (79 kg)  02/27/23 176 lb (79.8 kg)  02/11/23 174 lb 9.6 oz (79.2 kg)    Physical Exam  Constitutional: Patient appears well-developed and well-nourished. Obese  No distress.  HEENT: head atraumatic, normocephalic, pupils equal and reactive to light, neck supple Cardiovascular: Normal rate, regular rhythm and normal heart sounds.  No murmur heard. No BLE edema. Pulmonary/Chest: Effort normal and breath sounds normal. No respiratory distress. Abdominal: Soft.  There is no tenderness. Psychiatric: Patient has a normal mood and affect. behavior is normal. Judgment and thought content normal.  Results for orders placed or performed in visit on 01/01/23  HM DIABETES EYE EXAM  Result Value Ref Range   HM Diabetic Eye Exam No Retinopathy No Retinopathy      Assessment & Plan:   Problem List Items Addressed This Visit       Cardiovascular and Mediastinum   Essential hypertension - Primary   Relevant Medications  lisinopril (ZESTRIL) 10 MG tablet    Assessment and Plan    Hypertension Uncontrolled despite current regimen of Metoprolol 25mg  daily and Lisinopril 5mg  daily. Patient reported high readings at home, confirmed with elevated BP in office today (160/88). No symptoms of hypertensive urgency or emergency reported. Discussed the need for medication adjustment and addressed patient's concerns about potential side effects. -Increase Lisinopril to 10mg  daily (take two of the current 5mg  tablets until  supply is low, then will prescribe 10mg  tablets). -Continue Metoprolol 25mg  daily. -Check blood pressure at home over the weekend and report readings on Monday. -Plan for potential further medication adjustments based on reported readings.        Follow up plan: Return for send message regarding b/p on Monday.

## 2023-03-31 ENCOUNTER — Ambulatory Visit: Payer: Self-pay

## 2023-03-31 NOTE — Telephone Encounter (Signed)
    Chief Complaint: Pt. Seen 03/27/23, instructed to monitor BP and call back with readings. Friday - 143/79. Saturday  191/85  160/89  Sunday 181/91  Monday 171/100 and 190/83 Symptoms: No symptoms Frequency:  Pertinent Negatives: Patient denies symptoms Disposition: [] ED /[] Urgent Care (no appt availability in office) / [] Appointment(In office/virtual)/ []  [place] [facility]/ [] Home Care/ [x] Refused Recommended Disposition /[] [place] [place] [facility]/ [x]  Follow-up with PCP Additional Notes: Will go to ED if develops symptoms. Please advise pt.  Reason for Disposition  Systolic BP  >= 160 OR Diastolic >= 100  Answer Assessment - Initial Assessment Questions 1. BLOOD PRESSURE: "What is the blood pressure?" "Did you take at least two measurements 5 minutes apart?"     Today 171/100 190/83  2. ONSET: "When did you take your blood pressure?"     Today 3. HOW: "How did you take your blood pressure?" (e.g., automatic home BP monitor, visiting nurse)     Home cuff 4. HISTORY: "Do you have a history of high blood pressure?"     Yes 5. MEDICINES: "Are you taking any medicines for blood pressure?" "Have you missed any doses recently?"     Yes 6. OTHER SYMPTOMS: "Do you have any symptoms?" (e.g., blurred vision, chest pain, difficulty breathing, headache, weakness)     None 7. PREGNANCY: "Is there any chance you are pregnant?" "When was your last menstrual period?"     No  Protocols used: Blood Pressure - High-A-AH

## 2023-04-01 ENCOUNTER — Other Ambulatory Visit: Payer: Self-pay | Admitting: Nurse Practitioner

## 2023-04-01 ENCOUNTER — Telehealth: Payer: Self-pay

## 2023-04-01 ENCOUNTER — Ambulatory Visit: Payer: Self-pay | Admitting: *Deleted

## 2023-04-01 DIAGNOSIS — I10 Essential (primary) hypertension: Secondary | ICD-10-CM

## 2023-04-01 MED ORDER — AMLODIPINE BESYLATE 5 MG PO TABS: 5.0000 mg | ORAL_TABLET | Freq: Every day | ORAL | 0 refills | Status: DC

## 2023-04-01 NOTE — Telephone Encounter (Signed)
Lvm with all information provided by Kathleen Hickman including for pt to call and schedule an appt in 4 weeks

## 2023-04-01 NOTE — Telephone Encounter (Signed)
Attempted to return her call.   Left a voicemail to call back. 

## 2023-04-01 NOTE — Telephone Encounter (Signed)
  Chief Complaint: Medication Question Symptoms: NA Frequency: NA Pertinent Negatives: Patient denies NA Disposition: [] ED /[] Urgent Care (no appt availability in office) / [] Appointment(In office/virtual)/ []  [place] [place] [facility]/ [] Home Care/ [] Refused Recommended Disposition /[] [place] [facility]/ []  Follow-up with PCP Additional Notes:   Advised Amlodipine is taken in addition to the Lisinopril. Note from [place] Raynelle Fanning read to pt. Verbalizes understanding.

## 2023-04-01 NOTE — Telephone Encounter (Signed)
Left voicemail for patient to let her know Raynelle Fanning added Amlodipine for her blood pressure and to have her call back to schedule a 4 week follow up so that she can make sure its working ok and patient is tolerating it well.

## 2023-04-01 NOTE — Telephone Encounter (Signed)
Summary: med ?   Pt calleed back about bp med, Dr Zane Herald has added amlodipine. She wants to know if she should stop taking the lisinopril and just do the Amlodipine

## 2023-04-08 ENCOUNTER — Other Ambulatory Visit: Payer: Self-pay | Admitting: Nurse Practitioner

## 2023-04-08 DIAGNOSIS — I10 Essential (primary) hypertension: Secondary | ICD-10-CM

## 2023-04-08 NOTE — Telephone Encounter (Signed)
Requested Prescriptions  Refused Prescriptions Disp Refills   amLODipine (NORVASC) 5 MG tablet [Pharmacy Med Name: AMLODIPINE BESYLATE 5 MG TAB] 30 tablet 0    Sig: TAKE 1 TABLET (5 MG TOTAL) BY MOUTH DAILY.     Cardiovascular: Calcium Channel Blockers 2 Failed - 04/08/2023  3:21 PM      Failed - Last BP in normal range    BP Readings from Last 1 Encounters:  03/27/23 (!) 160/88         Passed - Last Heart Rate in normal range    Pulse Readings from Last 1 Encounters:  03/27/23 84         Passed - Valid encounter within last 6 months    Recent Outpatient Visits           1 week ago Essential hypertension   [facility] Berniece Salines, FNP   3 weeks ago Allergic sinusitis   [facility] Berniece Salines, FNP   1 month ago Essential hypertension   [facility] Mecum, Oswaldo Conroy, PA-C   1 month ago Essential hypertension   [facility] Berniece Salines, FNP   3 months ago Annual physical exam   [facility] Berniece Salines, FNP       Future Appointments             In 3 weeks Zane Herald, Rudolpho Sevin, FNP [facility], [facility]   In 4 months Zane Herald, Rudolpho Sevin, FNP [facility], [facility]

## 2023-04-18 ENCOUNTER — Ambulatory Visit (INDEPENDENT_AMBULATORY_CARE_PROVIDER_SITE_OTHER): Payer: PPO | Admitting: Nurse Practitioner

## 2023-04-18 VITALS — BP 170/90 | HR 91 | Ht <= 58 in | Wt 175.5 lb

## 2023-04-18 DIAGNOSIS — I10 Essential (primary) hypertension: Secondary | ICD-10-CM | POA: Diagnosis not present

## 2023-04-18 DIAGNOSIS — Z8249 Family history of ischemic heart disease and other diseases of the circulatory system: Secondary | ICD-10-CM | POA: Diagnosis not present

## 2023-04-18 MED ORDER — HYDRALAZINE HCL 10 MG PO TABS
10.0000 mg | ORAL_TABLET | Freq: Three times a day (TID) | ORAL | 0 refills | Status: DC | PRN
Start: 2023-04-18 — End: 2023-05-22

## 2023-04-18 MED ORDER — AMLODIPINE BESYLATE 10 MG PO TABS
10.0000 mg | ORAL_TABLET | Freq: Every day | ORAL | 0 refills | Status: DC
Start: 2023-04-18 — End: 2023-05-13

## 2023-04-18 NOTE — Patient Instructions (Addendum)
Increase amlodipine to 10 mg daily  Continue lisinopril 10 mg daily  Continue metoprolol 25 mg daily  Hydralazine 10 mg three times a day for blood pressure greater than 160/90

## 2023-04-18 NOTE — Progress Notes (Signed)
BP (!) 170/90   Pulse 91   Ht 4\' 10"  (1.473 m)   Wt 175 lb 8 oz (79.6 kg)   SpO2 98%   BMI 36.68 kg/m    Subjective:    Patient ID: Kathleen Hickman, female    DOB: 09/21/1955, 67 y.o.   MRN: [id number]  HPI: Kathleen Hickman is a 67 y.o. female  Chief Complaint  Patient presents with   Hypertension    Discussed the use of AI scribe software for clinical note transcription with the patient, who gave verbal consent to proceed.  History of Present Illness   The patient, with a history of hypertension, diabetes, and TIA, presents with uncontrolled hypertension despite being on multiple antihypertensive medications. The patient reports blood pressure readings ranging from 156/83 to 190/100. The patient has been taking metoprolol 25mg  daily in the morning, and lisinopril 10mg  and amlodipine 5mg  daily in the evening. Despite these medications, the patient's blood pressure remains high, with no significant difference in readings between morning and evening. The patient also reports a significant amount of stress due to her daughter's recent heart surgery. The patient has a history of diabetes, which is currently being managed by metformin and glipizide. The patient also had a TIA in the past, for which she was evaluated by a neurologist but not a cardiologist.       04/18/2023    8:11 AM 03/27/2023    8:19 AM 03/13/2023    9:12 AM  Depression screen PHQ 2/9  Decreased Interest 0 0 0  Down, Depressed, Hopeless 0 1 0  PHQ - 2 Score 0 1 0  Altered sleeping 0 1   Tired, decreased energy 0 1   Change in appetite 0 0   Feeling bad or failure about yourself  0 0   Trouble concentrating 0 0   Moving slowly or fidgety/restless 0 0   Suicidal thoughts 0 0   PHQ-9 Score 0 3   Difficult doing work/chores Not difficult at all Very difficult     Relevant past medical, surgical, family and social history reviewed and updated as indicated. Interim medical history since our last visit  reviewed. Allergies and medications reviewed and updated.  Review of Systems  Constitutional: Negative for fever or weight change.  Respiratory: Negative for cough and shortness of breath.   Cardiovascular: Negative for chest pain or palpitations.  Gastrointestinal: Negative for abdominal pain, no bowel changes.  Musculoskeletal: Negative for gait problem or joint swelling.  Skin: Negative for rash.  Neurological: Negative for dizziness or headache.  No other specific complaints in a complete review of systems (except as listed in HPI above).      Objective:    BP (!) 170/90   Pulse 91   Ht 4\' 10"  (1.473 m)   Wt 175 lb 8 oz (79.6 kg)   SpO2 98%   BMI 36.68 kg/m   BP Readings from Last 3 Encounters:  04/18/23 (!) 170/90  03/27/23 (!) 160/88  02/27/23 (!) 168/84     Wt Readings from Last 3 Encounters:  04/18/23 175 lb 8 oz (79.6 kg)  03/27/23 174 lb 1.6 oz (79 kg)  02/27/23 176 lb (79.8 kg)    Physical Exam  Constitutional: Patient appears well-developed and well-nourished. Obese  No distress.  HEENT: head atraumatic, normocephalic, pupils equal and reactive to light, neck supple Cardiovascular: Normal rate, regular rhythm and normal heart sounds.  No murmur heard. No BLE edema. Pulmonary/Chest: Effort normal and breath  sounds normal. No respiratory distress. Abdominal: Soft.  There is no tenderness. Psychiatric: Patient has a normal mood and affect. behavior is normal. Judgment and thought content normal.  Results for orders placed or performed in visit on 01/01/23  HM DIABETES EYE EXAM   Collection Time: 12/31/22  3:10 PM  Result Value Ref Range   HM Diabetic Eye Exam No Retinopathy No Retinopathy   Last metabolic panel Lab Results  Component Value Date   GLUCOSE 297 (H) 10/14/2022   NA 136 10/14/2022   K 3.7 10/14/2022   CL 104 10/14/2022   CO2 21 (L) 10/14/2022   BUN 12 10/14/2022   CREATININE 0.67 10/14/2022   GFRNONAA >60 10/14/2022   CALCIUM 9.4  10/14/2022   PROT 7.6 10/14/2022   ALBUMIN 4.3 10/14/2022   BILITOT 0.8 10/14/2022   ALKPHOS 69 10/14/2022   AST 31 10/14/2022   ALT 29 10/14/2022   ANIONGAP 11 10/14/2022   Last lipids Lab Results  Component Value Date   CHOL 211 (H) 10/15/2022   HDL 48 10/15/2022   LDLCALC 134 (H) 10/15/2022   TRIG 147 10/15/2022   CHOLHDL 4.4 10/15/2022   Last hemoglobin A1c Lab Results  Component Value Date   HGBA1C 9.9 (H) 10/14/2022        Assessment & Plan:   Problem List Items Addressed This Visit       Cardiovascular and Mediastinum   Essential hypertension - Primary   Relevant Medications   amLODipine (NORVASC) 10 MG tablet   hydrALAZINE (APRESOLINE) 10 MG tablet   Other Relevant Orders   Ambulatory referral to Cardiology   US Renal Artery Stenosis   Other Visit Diagnoses       Family history of cardiac disorder       Relevant Orders   Ambulatory referral to Cardiology        Assessment and Plan    Uncontrolled Hypertension Despite current regimen of Metoprolol 25mg  daily, Lisinopril 10mg  daily, and Amlodipine 5mg  daily, blood pressure readings remain elevated (up to 190s/100). Patient reports adherence to medication regimen, taking Metoprolol in the morning and Lisinopril and Amlodipine in the evening. -Increase Amlodipine to 10mg  daily. Patient can take two of the current 5mg  tablets until the new prescription is filled. -Add Hydralazine as needed for blood pressure readings greater than 160/90, up to three times daily. -Order renal ultrasound to evaluate for potential renal causes of hypertension. -Refer to Cardiology for further evaluation and management. -Check blood pressure in one week and send a message with readings. -Follow-up appointment in two weeks if not seen by Cardiology.  Diabetes Mellitus Currently managed with Metformin and Glipizide. Last A1C was 7.2. -Continue current management. -Transfer care from Endocrinology to primary care. -Follow-up  as scheduled in April.  History of TIA Patient reports anxiety related to previous TIA and current uncontrolled hypertension. -Consider cortisol testing for potential hypercortisolism as a contributing factor to uncontrolled hypertension and diabetes. This would involve a dexamethasone suppression test with follow-up lab work and potential imaging if initial test is abnormal.        Follow up plan: Return in about 2 weeks (around 05/02/2023) for follow up.

## 2023-04-25 ENCOUNTER — Ambulatory Visit
Admission: RE | Admit: 2023-04-25 | Discharge: 2023-04-25 | Disposition: A | Discharge status: Home / Self Care | Payer: PPO | Source: Ambulatory Visit | Attending: Nurse Practitioner | Admitting: Nurse Practitioner

## 2023-04-25 DIAGNOSIS — I10 Essential (primary) hypertension: Secondary | ICD-10-CM | POA: Insufficient documentation

## 2023-04-30 ENCOUNTER — Ambulatory Visit: Payer: PPO | Admitting: Nurse Practitioner

## 2023-05-01 NOTE — Progress Notes (Signed)
 BP 138/62   Pulse 96   Resp 18   Ht 4' 10 (1.473 m)   Wt 175 lb (79.4 kg)   SpO2 100%   BMI 36.58 kg/m    Subjective:    Patient ID: Kathleen Hickman, female    DOB: [birth year], 68 y.o.   MRN: [id number]  HPI: Kathleen Hickman is a 68 y.o. female  Chief Complaint  Patient presents with   Medical Management of Chronic Issues    2 WEEK f/u   Hypertension    Discussed the use of AI scribe software for clinical note transcription with the patient, who gave verbal consent to proceed.  History of Present Illness   Kathleen Hickman, a patient with a history of hypertension, type 2 diabetes, hyperlipidemia, and hx of a transient ischemic attack (TIA), presents for a follow-up visit. She is currently on amlodipine  10mg  daily, hydralazine  10mg  three times a day as needed for blood pressure greater than 160/90, lisinopril  10mg  daily, and metoprolol  25mg  daily. Her blood pressures have been ranging from 140s/70s to 170s/80s. She recently had a renal ultrasound, which was normal. A referral for cardiology was made during her last office visit.  For her type 2 diabetes, she is on metformin  500mg  twice daily and glipizide  2.5mg  daily. She also has a history of hyperlipidemia and is currently taking atorvastatin  80mg  daily. For her history of TIA, she is on Plavix  75mg  daily.  She reports feeling dizzy occasionally, which she attributes to her medication. She denies having any headaches. She has been managing her diet, exercising, and drinking plenty of water. She has not been checking her blood sugar levels recently due to being preoccupied with her blood pressure and personal matters.       04/18/2023    8:11 AM 03/27/2023    8:19 AM 03/13/2023    9:12 AM  Depression screen PHQ 2/9  Decreased Interest 0 0 0  Down, Depressed, Hopeless 0 1 0  PHQ - 2 Score 0 1 0  Altered sleeping 0 1   Tired, decreased energy 0 1   Change in appetite 0 0   Feeling bad or failure about yourself  0 0   Trouble  concentrating 0 0   Moving slowly or fidgety/restless 0 0   Suicidal thoughts 0 0   PHQ-9 Score 0 3   Difficult doing work/chores Not difficult at all Very difficult     Relevant past medical, surgical, family and social history reviewed and updated as indicated. Interim medical history since our last visit reviewed. Allergies and medications reviewed and updated.  Review of Systems  Constitutional: Negative for fever or weight change.  Respiratory: Negative for cough and shortness of breath.   Cardiovascular: Negative for chest pain or palpitations.  Gastrointestinal: Negative for abdominal pain, no bowel changes.  Musculoskeletal: Negative for gait problem or joint swelling.  Skin: Negative for rash.  Neurological: Negative for dizziness or headache.  No other specific complaints in a complete review of systems (except as listed in HPI above).      Objective:    BP 138/62   Pulse 96   Resp 18   Ht 4' 10 (1.473 m)   Wt 175 lb (79.4 kg)   SpO2 100%   BMI 36.58 kg/m   BP Readings from Last 3 Encounters:  05/02/23 138/62  04/18/23 (!) 170/90  03/27/23 (!) 160/88     Wt Readings from Last 3 Encounters:  05/02/23 175 lb (79.4 kg)  04/18/23 175 lb 8 oz (79.6 kg)  03/27/23 174 lb 1.6 oz (79 kg)    Physical Exam  Constitutional: Patient appears well-developed and well-nourished. Obese  No distress.  HEENT: head atraumatic, normocephalic, pupils equal and reactive to light, neck supple Cardiovascular: Normal rate, regular rhythm and normal heart sounds.  No murmur heard. No BLE edema. Pulmonary/Chest: Effort normal and breath sounds normal. No respiratory distress. Abdominal: Soft.  There is no tenderness. Psychiatric: Patient has a normal mood and affect. behavior is normal. Judgment and thought content normal.  Results for orders placed or performed in visit on 01/01/23  HM DIABETES EYE EXAM   Collection Time: 12/31/22  3:10 PM  Result Value Ref Range   HM Diabetic  Eye Exam No Retinopathy No Retinopathy   Last CBC Lab Results  Component Value Date   WBC 6.3 10/14/2022   WBC 6.3 10/14/2022   HGB 14.7 10/14/2022   HGB 14.9 10/14/2022   HCT 42.7 10/14/2022   HCT 43.3 10/14/2022   MCV 91.0 10/14/2022   MCV 91.4 10/14/2022   MCH 31.3 10/14/2022   MCH 31.4 10/14/2022   RDW 12.2 10/14/2022   RDW 12.2 10/14/2022   PLT 279 10/14/2022   PLT 290 10/14/2022   Last metabolic panel Lab Results  Component Value Date   GLUCOSE 297 (H) 10/14/2022   NA 136 10/14/2022   K 3.7 10/14/2022   CL 104 10/14/2022   CO2 21 (L) 10/14/2022   BUN 12 10/14/2022   CREATININE 0.67 10/14/2022   GFRNONAA >60 10/14/2022   CALCIUM  9.4 10/14/2022   PROT 7.6 10/14/2022   ALBUMIN 4.3 10/14/2022   BILITOT 0.8 10/14/2022   ALKPHOS 69 10/14/2022   AST 31 10/14/2022   ALT 29 10/14/2022   ANIONGAP 11 10/14/2022        Assessment & Plan:   Problem List Items Addressed This Visit       Cardiovascular and Mediastinum   Essential hypertension - Primary   Relevant Medications   lisinopril  (ZESTRIL ) 20 MG tablet   Other Relevant Orders   CBC with Differential/Platelet   COMPLETE METABOLIC PANEL WITH GFR     Endocrine   Uncontrolled type 2 diabetes mellitus with hyperglycemia, without long-term current use of insulin  (HCC)   Relevant Medications   lisinopril  (ZESTRIL ) 20 MG tablet   Other Relevant Orders   Hemoglobin A1c     Other   Mixed hyperlipidemia   Relevant Medications   lisinopril  (ZESTRIL ) 20 MG tablet   Other Relevant Orders   Lipid panel   Other Visit Diagnoses       History of TIA (transient ischemic attack)       Relevant Orders   Lipid panel        Assessment and Plan    Hypertension Blood pressure readings ranging from 140s/70s to 170s/80s despite current regimen of amlodipine  10mg  daily, hydralazine  10mg  TID as needed, lisinopril  10mg  daily, and metoprolol  25mg  daily. Recent renal ultrasound normal. Cardiology referral in  process. -Increase lisinopril  to 20mg  daily. -Check labs today. -Advise patient to continue monitoring blood pressure at home.  Type 2 Diabetes Currently on metformin  500mg  BID and glipizide  2.5mg  daily. Patient has not been checking blood sugars recently due to focus on blood pressure management. -Encourage patient to resume regular blood sugar checks. -Check labs today.  Hyperlipidemia Currently on atorvastatin  80mg  daily. -Continue current regimen. -Check labs today.  History of TIA Currently on Plavix  75mg  daily. -Continue current regimen.  Follow-up -Advise  patient to contact cardiology for appointment if she has not reached out. -Check labs today. -Print out medication changes for patient's reference. -Follow-up appointment to assess blood pressure control and lab results.        Follow up plan: Return in 4 weeks (on 05/30/2023) for follow up.

## 2023-05-02 ENCOUNTER — Ambulatory Visit (INDEPENDENT_AMBULATORY_CARE_PROVIDER_SITE_OTHER): Payer: PPO | Admitting: Nurse Practitioner

## 2023-05-02 ENCOUNTER — Encounter: Payer: Self-pay | Admitting: Nurse Practitioner

## 2023-05-02 VITALS — BP 138/62 | HR 96 | Resp 18 | Ht <= 58 in | Wt 175.0 lb

## 2023-05-02 DIAGNOSIS — E1165 Type 2 diabetes mellitus with hyperglycemia: Secondary | ICD-10-CM | POA: Diagnosis not present

## 2023-05-02 DIAGNOSIS — E782 Mixed hyperlipidemia: Secondary | ICD-10-CM

## 2023-05-02 DIAGNOSIS — Z8673 Personal history of transient ischemic attack (TIA), and cerebral infarction without residual deficits: Secondary | ICD-10-CM

## 2023-05-02 DIAGNOSIS — I10 Essential (primary) hypertension: Secondary | ICD-10-CM | POA: Diagnosis not present

## 2023-05-02 MED ORDER — LISINOPRIL 20 MG PO TABS
20.0000 mg | ORAL_TABLET | Freq: Every day | ORAL | 0 refills | Status: DC
Start: 2023-05-02 — End: 2023-05-22

## 2023-05-02 NOTE — Patient Instructions (Signed)
 Increase lisinopril to 20 mg daily Can take 2 tabs of the 10 mg dose

## 2023-05-03 LAB — LIPID PANEL
Cholesterol: 111 mg/dL (ref <200)
HDL: 50 mg/dL (ref >=50)
LDL Cholesterol (Calc): 43 mg/dL
Non-HDL Cholesterol (Calc): 61 mg/dL (ref <130)
Total CHOL/HDL Ratio: 2.2 (calc) (ref <5.0)
Triglycerides: 93 mg/dL (ref <150)

## 2023-05-03 LAB — COMPLETE METABOLIC PANEL WITH GFR
AG Ratio: 2.5 (calc) (ref 1.0–2.5)
ALT: 24 U/L (ref 6–29)
AST: 18 U/L (ref 10–35)
Albumin: 4.8 g/dL (ref 3.6–5.1)
Alkaline phosphatase (APISO): 60 U/L (ref 37–153)
BUN: 14 mg/dL (ref 7–25)
CO2: 26 mmol/L (ref 20–32)
Calcium: 9.6 mg/dL (ref 8.6–10.4)
Chloride: 103 mmol/L (ref 98–110)
Creat: 0.68 mg/dL (ref 0.50–1.05)
Globulin: 1.9 g/dL (ref 1.9–3.7)
Glucose, Bld: 324 mg/dL — ABNORMAL HIGH (ref 65–99)
Potassium: 4.1 mmol/L (ref 3.5–5.3)
Sodium: 138 mmol/L (ref 135–146)
Total Bilirubin: 0.5 mg/dL (ref 0.2–1.2)
Total Protein: 6.7 g/dL (ref 6.1–8.1)
eGFR: 95 mL/min/{1.73_m2} (ref >=60)

## 2023-05-03 LAB — CBC WITH DIFFERENTIAL/PLATELET
Absolute Lymphocytes: 1642 {cells}/uL (ref 850–3900)
Absolute Monocytes: 456 {cells}/uL (ref 200–950)
Basophils Absolute: 29 {cells}/uL (ref 0–200)
Basophils Relative: 0.5 %
Eosinophils Absolute: 171 {cells}/uL (ref 15–500)
Eosinophils Relative: 3 %
HCT: 39.9 % (ref 35.0–45.0)
Hemoglobin: 13.6 g/dL (ref 11.7–15.5)
MCH: 31.6 pg (ref 27.0–33.0)
MCHC: 34.1 g/dL (ref 32.0–36.0)
MCV: 92.8 fL (ref 80.0–100.0)
MPV: 11 fL (ref 7.5–12.5)
Monocytes Relative: 8 %
Neutro Abs: 3403 {cells}/uL (ref 1500–7800)
Neutrophils Relative %: 59.7 %
Platelets: 262 10*3/uL (ref 140–400)
RBC: 4.3 10*6/uL (ref 3.80–5.10)
RDW: 11.8 % (ref 11.0–15.0)
Total Lymphocyte: 28.8 %
WBC: 5.7 10*3/uL (ref 3.8–10.8)

## 2023-05-03 LAB — HEMOGLOBIN A1C
Hgb A1c MFr Bld: 8.6 %{Hb} — ABNORMAL HIGH (ref <5.7)
Mean Plasma Glucose: 200 mg/dL
eAG (mmol/L): 11.1 mmol/L

## 2023-05-05 ENCOUNTER — Other Ambulatory Visit: Payer: Self-pay | Admitting: Nurse Practitioner

## 2023-05-05 DIAGNOSIS — E1165 Type 2 diabetes mellitus with hyperglycemia: Secondary | ICD-10-CM

## 2023-05-05 MED ORDER — GLIPIZIDE ER 5 MG PO TB24
5.0000 mg | ORAL_TABLET | Freq: Every day | ORAL | 0 refills | Status: DC
Start: 2023-05-05 — End: 2023-05-20

## 2023-05-13 ENCOUNTER — Other Ambulatory Visit: Payer: Self-pay | Admitting: Nurse Practitioner

## 2023-05-13 DIAGNOSIS — I10 Essential (primary) hypertension: Secondary | ICD-10-CM

## 2023-05-13 NOTE — Telephone Encounter (Signed)
Requested Prescriptions  Pending Prescriptions Disp Refills   amLODipine (NORVASC) 10 MG tablet [Pharmacy Med Name: AMLODIPINE BESYLATE 10 MG TAB] 90 tablet 1    Sig: TAKE 1 TABLET BY MOUTH EVERY DAY     Cardiovascular: Calcium Channel Blockers 2 Passed - 05/13/2023  1:00 PM      Passed - Last BP in normal range    BP Readings from Last 1 Encounters:  05/02/23 138/62         Passed - Last Heart Rate in normal range    Pulse Readings from Last 1 Encounters:  05/02/23 96         Passed - Valid encounter within last 6 months    Recent Outpatient Visits           1 week ago Essential hypertension   [facility] Berniece Salines, FNP   3 weeks ago Essential hypertension   [facility] Berniece Salines, FNP   1 month ago Essential hypertension   [facility] Berniece Salines, FNP   2 months ago Allergic sinusitis   [facility] Berniece Salines, FNP   2 months ago Essential hypertension   [facility] [facility] Mecum, Oswaldo Conroy, PA-C       Future Appointments             In 2 weeks Zane Herald, Rudolpho Sevin, FNP [facility], [facility]   In 1 month Yates Decamp, MD [facility]   In 3 months Zane Herald, Rudolpho Sevin, FNP [facility], [facility]

## 2023-05-14 ENCOUNTER — Other Ambulatory Visit: Payer: Self-pay | Admitting: Nurse Practitioner

## 2023-05-14 DIAGNOSIS — E1165 Type 2 diabetes mellitus with hyperglycemia: Secondary | ICD-10-CM

## 2023-05-14 DIAGNOSIS — I10 Essential (primary) hypertension: Secondary | ICD-10-CM

## 2023-05-14 NOTE — Telephone Encounter (Signed)
Requests are too soon for refill.  Requested Prescriptions  Pending Prescriptions Disp Refills   amLODipine (NORVASC) 5 MG tablet [Pharmacy Med Name: AMLODIPINE BESYLATE 5 MG TAB] 30 tablet 0    Sig: TAKE 1 TABLET (5 MG TOTAL) BY MOUTH DAILY.     Cardiovascular: Calcium Channel Blockers 2 Passed - 05/14/2023  2:27 PM      Passed - Last BP in normal range    BP Readings from Last 1 Encounters:  05/02/23 138/62         Passed - Last Heart Rate in normal range    Pulse Readings from Last 1 Encounters:  05/02/23 96         Passed - Valid encounter within last 6 months    Recent Outpatient Visits           1 week ago Essential hypertension   [facility] Berniece Salines, FNP   3 weeks ago Essential hypertension   [facility] Berniece Salines, FNP   1 month ago Essential hypertension   [facility] Della Goo F, FNP   2 months ago Allergic sinusitis   [facility] Berniece Salines, FNP   2 months ago Essential hypertension   [facility] Mecum, Oswaldo Conroy, PA-C       Future Appointments             In 2 weeks Zane Herald, Rudolpho Sevin, FNP [facility]   In 1 month Yates Decamp, MD [facility]   In 3 months Zane Herald, Rudolpho Sevin, FNP [facility], [facility]             lisinopril (ZESTRIL) 20 MG tablet [Pharmacy Med Name: LISINOPRIL 20 MG TABLET] 30 tablet 0    Sig: TAKE 1 TABLET BY MOUTH EVERY DAY     Cardiovascular:  ACE Inhibitors Passed - 05/14/2023  2:27 PM      Passed - Cr in normal range and within 180 days    Creat  Date Value Ref Range Status  05/02/2023 0.68 0.50 - 1.05 mg/dL Final   Creatinine, Urine  Date Value Ref Range Status  11/07/2022 71 20 - 275 mg/dL Final         Passed - K in normal range and within 180 days    Potassium  Date Value Ref  Range Status  05/02/2023 4.1 3.5 - 5.3 mmol/L Final         Passed - Patient is not pregnant      Passed - Last BP in normal range    BP Readings from Last 1 Encounters:  05/02/23 138/62         Passed - Valid encounter within last 6 months    Recent Outpatient Visits           1 week ago Essential hypertension   [facility] Berniece Salines, FNP   3 weeks ago Essential hypertension   [facility] Berniece Salines, FNP   1 month ago Essential hypertension   [facility] Berniece Salines, FNP   2 months ago Allergic sinusitis   [facility]ines, FNP   2 months ago Essential hypertension   [facility] Mecum, Oswaldo Conroy, [place]       Future  Appointments             In 2 weeks Zane Herald, Rudolpho Sevin, FNP [facility], [facility]   In 1 month Yates Decamp, MD [facility]   In 3 months Zane Herald, Rudolpho Sevin, FNP [facility], [facility]             metFORMIN (GLUCOPHAGE-XR) 500 MG 24 hr tablet [Pharmacy Med Name: METFORMIN HCL ER 500 MG TABLET] 180 tablet 1    Sig: TAKE 1 TABLET BY MOUTH 2 TIMES DAILY WITH A MEAL.     Endocrinology:  Diabetes - Biguanides Failed - 05/14/2023  2:27 PM      Failed - HBA1C is between 0 and 7.9 and within 180 days    Hgb A1c MFr Bld  Date Value Ref Range Status  05/02/2023 8.6 (H) <5.7 % of total Hgb Final    Comment:    For someone without known diabetes, a hemoglobin A1c value of 6.5% or greater indicates that they may have  diabetes and this should be confirmed with a follow-up  test. . For someone with known diabetes, a value <7% indicates  that their diabetes is well controlled and a value  greater than or equal to 7% indicates suboptimal  control. A1c targets should be individualized based on  duration of diabetes, age, comorbid  conditions, and  other considerations. . Currently, no consensus exists regarding use of hemoglobin A1c for diagnosis of diabetes for children. .          Failed - B12 Level in normal range and within 720 days    No results found for: "VITAMINB12"       Passed - Cr in normal range and within 360 days    Creat  Date Value Ref Range Status  05/02/2023 0.68 0.50 - 1.05 mg/dL Final   Creatinine, Urine  Date Value Ref Range Status  11/07/2022 71 20 - 275 mg/dL Final         Passed - eGFR in normal range and within 360 days    GFR calc Af Amer  Date Value Ref Range Status  05/05/2009  >60 mL/min Final   >60        The eGFR has been calculated using the MDRD equation. This calculation has not been validated in all clinical situations. eGFR's persistently <60 mL/min signify possible Chronic Kidney Disease.   GFR, Estimated  Date Value Ref Range Status  10/14/2022 >60 >60 mL/min Final    Comment:    (NOTE) Calculated using the CKD-EPI Creatinine Equation (2021)    eGFR  Date Value Ref Range Status  05/02/2023 95 > OR = 60 mL/min/1.14m2 Final         Passed - Valid encounter within last 6 months    Recent Outpatient Visits           1 week ago Essential hypertension   [facility] Berniece Salines, FNP   3 weeks ago Essential hypertension   [facility] Berniece Salines, FNP   1 month ago Essential hypertension   [facility] Berniece Salines, FNP   2 months ago Allergic sinusitis   [facility] Berniece Salines, FNP   2 months ago Essential hypertension   [facility] Mecum, Oswaldo Conroy, [place]       Future Appointments  In 2 weeks Zane Herald, Rudolpho Sevin, FNP [facility], [facility]   In 1 month Yates Decamp, MD [facility]   In 3 months Zane Herald, Rudolpho Sevin, FNP [facility], [facility]            Passed - CBC within normal limits and completed in the last 12 months    WBC  Date Value Ref Range Status  05/02/2023 5.7 3.8 - 10.8 Thousand/uL Final   RBC  Date Value Ref Range Status  05/02/2023 4.30 3.80 - 5.10 Million/uL Final   Hemoglobin  Date Value Ref Range Status  05/02/2023 13.6 11.7 - 15.5 g/dL Final   HCT  Date Value Ref Range Status  05/02/2023 39.9 35.0 - 45.0 % Final   MCHC  Date Value Ref Range Status  05/02/2023 34.1 32.0 - 36.0 g/dL Final    Comment:    For adults, a slight decrease in the calculated MCHC value (in the range of 30 to 32 g/dL) is most likely not clinically significant; however, it should be interpreted with caution in correlation with other red cell parameters and the patient's clinical condition.    [facility]  Date Value Ref Range Status  05/02/2023 31.6 27.0 - 33.0 pg Final   MCV  Date Value Ref Range Status  05/02/2023 92.8 80.0 - 100.0 fL Final   No results found for: "PLTCOUNTKUC", "LABPLAT", "POCPLA" RDW  Date Value Ref Range Status  05/02/2023 11.8 11.0 - 15.0 % Final          hydrALAZINE (APRESOLINE) 10 MG tablet [Pharmacy Med Name: HYDRALAZINE 10 MG TABLET] 90 tablet 0    Sig: TAKE 1 TABLET 3 TIMES DAILY AS NEEDED (TAKE WHEN BLOOD PRESSURE GREATER THAN 160/90).     Cardiovascular:  Vasodilators Failed - 05/14/2023  2:27 PM      Failed - ANA Screen, Ifa, Serum in normal range and within 360 days    No results found for: "ANA", "ANATITER", "LABANTI"       Passed - HCT in normal range and within 360 days    HCT  Date Value Ref Range Status  05/02/2023 39.9 35.0 - 45.0 % Final         Passed - HGB in normal range and within 360 days    Hemoglobin  Date Value Ref Range Status  05/02/2023 13.6 11.7 - 15.5 g/dL Final         Passed - RBC in normal range and within 360 days    RBC  Date Value Ref Range Status  05/02/2023 4.30 3.80 - 5.10 Million/uL Final         Passed  - WBC in normal range and within 360 days    WBC  Date Value Ref Range Status  05/02/2023 5.7 3.8 - 10.8 Thousand/uL Final         Passed - PLT in normal range and within 360 days    Platelets  Date Value Ref Range Status  05/02/2023 262 140 - 400 Thousand/uL Final         Passed - Last BP in normal range    BP Readings from Last 1 Encounters:  05/02/23 138/62         Passed - Valid encounter within last 12 months    Recent Outpatient Visits           1 week ago Essential hypertension   [facility] [place], Rudolpho Sevin,  FNP   3 weeks ago Essential hypertension   [facility] Della Goo F, FNP   1 month ago Essential hypertension   [facility] Berniece Salines, FNP   2 months ago Allergic sinusitis   [facility] Berniece Salines, FNP   2 months ago Essential hypertension   [facility] Mecum, Oswaldo Conroy, PA-C       Future Appointments             In 2 weeks Zane Herald, Rudolpho Sevin, FNP [facility], [facility]   In 1 month Yates Decamp, MD [facility], [facility]   In 3 months Zane Herald, Rudolpho Sevin, FNP [facility]             glipiZIDE (GLUCOTROL XL) 5 MG 24 hr tablet [Pharmacy Med Name: GLIPIZIDE ER 5 MG TABLET] 90 tablet 0    Sig: TAKE 1 TABLET BY MOUTH EVERY DAY WITH BREAKFAST     Endocrinology:  Diabetes - Sulfonylureas Failed - 05/14/2023  2:27 PM      Failed - HBA1C is between 0 and 7.9 and within 180 days    Hgb A1c MFr Bld  Date Value Ref Range Status  05/02/2023 8.6 (H) <5.7 % of total Hgb Final    Comment:    For someone without known diabetes, a hemoglobin A1c value of 6.5% or greater indicates that they may have  diabetes and this should be confirmed with a follow-up  test. . For someone with known diabetes, a value <7% indicates  that their diabetes is well  controlled and a value  greater than or equal to 7% indicates suboptimal  control. A1c targets should be individualized based on  duration of diabetes, age, comorbid conditions, and  other considerations. . Currently, no consensus exists regarding use of hemoglobin A1c for diagnosis of diabetes for children. .          Passed - Cr in normal range and within 360 days    Creat  Date Value Ref Range Status  05/02/2023 0.68 0.50 - 1.05 mg/dL Final   Creatinine, Urine  Date Value Ref Range Status  11/07/2022 71 20 - 275 mg/dL Final         Passed - Valid encounter within last 6 months    Recent Outpatient Visits           1 week ago Essential hypertension   [facility] Berniece Salines, FNP   3 weeks ago Essential hypertension   [facility] Berniece Salines, FNP   1 month ago Essential hypertension   [facility] Salines, FNP   2 months ago Allergic sinusitis   [facility] Berniece Salines, FNP   2 months ago Essential hypertension   [facility] Mecum, Oswaldo Conroy, PA-C       Future Appointments             In 2 weeks Zane Herald, Rudolpho Sevin, FNP [facility], [facility]   In 1 month Yates Decamp, MD [facility], [facility]   In 3 months Zane Herald, Rudolpho Sevin, FNP [facility], [facility]

## 2023-05-19 NOTE — Progress Notes (Unsigned)
There were no vitals taken for this visit.   Subjective:    Patient ID: Kathleen Hickman, female    DOB: 10-30-1955, 68 y.o.   MRN: [id number]  HPI: Kathleen Hickman is a 68 y.o. female  No chief complaint on file.   Discussed the use of AI scribe software for clinical note transcription with the patient, who gave verbal consent to proceed.  History of Present Illness           04/18/2023    8:11 AM 03/27/2023    8:19 AM 03/13/2023    9:12 AM  Depression screen PHQ 2/9  Decreased Interest 0 0 0  Down, Depressed, Hopeless 0 1 0  PHQ - 2 Score 0 1 0  Altered sleeping 0 1   Tired, decreased energy 0 1   Change in appetite 0 0   Feeling bad or failure about yourself  0 0   Trouble concentrating 0 0   Moving slowly or fidgety/restless 0 0   Suicidal thoughts 0 0   PHQ-9 Score 0 3   Difficult doing work/chores Not difficult at all Very difficult     Relevant past medical, surgical, family and social history reviewed and updated as indicated. Interim medical history since our last visit reviewed. Allergies and medications reviewed and updated.  Review of Systems  Per HPI unless specifically indicated above     Objective:    There were no vitals taken for this visit.  {Vitals History (Optional):23777} Wt Readings from Last 3 Encounters:  05/02/23 175 lb (79.4 kg)  04/18/23 175 lb 8 oz (79.6 kg)  03/27/23 174 lb 1.6 oz (79 kg)    Physical Exam  Results for orders placed or performed in visit on 05/02/23  CBC with Differential/Platelet   Collection Time: 05/02/23  8:58 AM  Result Value Ref Range   WBC 5.7 3.8 - 10.8 Thousand/uL   RBC 4.30 3.80 - 5.10 Million/uL   Hemoglobin 13.6 11.7 - 15.5 g/dL   HCT 78.4 69.6 - 29.5 %   MCV 92.8 80.0 - 100.0 fL   MCH 31.6 27.0 - 33.0 pg   MCHC 34.1 32.0 - 36.0 g/dL   RDW 28.4 13.2 - 44.0 %   Platelets 262 140 - 400 Thousand/uL   MPV 11.0 7.5 - 12.5 fL   Neutro Abs 3,403 1,500 - 7,800 cells/uL   Absolute Lymphocytes 1,642  850 - 3,900 cells/uL   Absolute Monocytes 456 200 - 950 cells/uL   Eosinophils Absolute 171 15 - 500 cells/uL   Basophils Absolute 29 0 - 200 cells/uL   Neutrophils Relative % 59.7 %   Total Lymphocyte 28.8 %   Monocytes Relative 8.0 %   Eosinophils Relative 3.0 %   Basophils Relative 0.5 %  COMPLETE METABOLIC PANEL WITH GFR   Collection Time: 05/02/23  8:58 AM  Result Value Ref Range   Glucose, Bld 324 (H) 65 - 99 mg/dL   BUN 14 7 - 25 mg/dL   Creat 1.02 7.25 - 3.66 mg/dL   eGFR 95 > OR = 60 YQ/IHK/7.42V9   BUN/Creatinine Ratio SEE NOTE: 6 - 22 (calc)   Sodium 138 135 - 146 mmol/L   Potassium 4.1 3.5 - 5.3 mmol/L   Chloride 103 98 - 110 mmol/L   CO2 26 20 - 32 mmol/L   Calcium 9.6 8.6 - 10.4 mg/dL   Total Protein 6.7 6.1 - 8.1 g/dL   Albumin 4.8 3.6 - 5.1 g/dL   Globulin  1.9 1.9 - 3.7 g/dL (calc)   AG Ratio 2.5 1.0 - 2.5 (calc)   Total Bilirubin 0.5 0.2 - 1.2 mg/dL   Alkaline phosphatase (APISO) 60 37 - 153 U/L   AST 18 10 - 35 U/L   ALT 24 6 - 29 U/L  Lipid panel   Collection Time: 05/02/23  8:58 AM  Result Value Ref Range   Cholesterol 111 <200 mg/dL   HDL 50 > OR = 50 mg/dL   Triglycerides 93 <161 mg/dL   LDL Cholesterol (Calc) 43 mg/dL (calc)   Total CHOL/HDL Ratio 2.2 <5.0 (calc)   Non-HDL Cholesterol (Calc) 61 <096 mg/dL (calc)  Hemoglobin E4V   Collection Time: 05/02/23  8:58 AM  Result Value Ref Range   Hgb A1c MFr Bld 8.6 (H) <5.7 % of total Hgb   Mean Plasma Glucose 200 mg/dL   eAG (mmol/L) 40.9 mmol/L   {Labs (Optional):23779}    Assessment & Plan:   Problem List Items Addressed This Visit   None    Assessment and Plan             Follow up plan: No follow-ups on file.

## 2023-05-20 ENCOUNTER — Other Ambulatory Visit: Payer: Self-pay | Admitting: Nurse Practitioner

## 2023-05-20 ENCOUNTER — Encounter: Payer: Self-pay | Admitting: Nurse Practitioner

## 2023-05-20 ENCOUNTER — Telehealth: Payer: Self-pay

## 2023-05-20 ENCOUNTER — Ambulatory Visit: Payer: PPO | Admitting: Nurse Practitioner

## 2023-05-20 VITALS — BP 154/74 | HR 90 | Temp 98.2°F | Resp 16 | Ht <= 58 in | Wt 177.6 lb

## 2023-05-20 DIAGNOSIS — E1165 Type 2 diabetes mellitus with hyperglycemia: Secondary | ICD-10-CM

## 2023-05-20 DIAGNOSIS — Z7984 Long term (current) use of oral hypoglycemic drugs: Secondary | ICD-10-CM | POA: Diagnosis not present

## 2023-05-20 DIAGNOSIS — I10 Essential (primary) hypertension: Secondary | ICD-10-CM | POA: Diagnosis not present

## 2023-05-20 MED ORDER — GLIPIZIDE ER 10 MG PO TB24
10.0000 mg | ORAL_TABLET | Freq: Every day | ORAL | Status: DC
Start: 2023-05-20 — End: 2023-06-10

## 2023-05-20 MED ORDER — METOPROLOL SUCCINATE ER 25 MG PO TB24
25.0000 mg | ORAL_TABLET | Freq: Two times a day (BID) | ORAL | Status: DC
Start: 2023-05-20 — End: 2023-05-22

## 2023-05-20 MED ORDER — OZEMPIC (0.25 OR 0.5 MG/DOSE) 2 MG/3ML ~~LOC~~ SOPN
0.2500 mg | PEN_INJECTOR | SUBCUTANEOUS | 0 refills | Status: DC
Start: 2023-05-20 — End: 2023-06-10

## 2023-05-20 NOTE — Telephone Encounter (Signed)
[name] (Key: [id number]) [phone] Ozempic (0.25 or 0.5 MG/DOSE) 2MG Ronny Bacon pen-injectors status: PA Response - ApprovedCreated: January 28th, 2025 [phone]: January 28th, 2025

## 2023-05-20 NOTE — Telephone Encounter (Signed)
PA started on ozempic

## 2023-05-22 ENCOUNTER — Other Ambulatory Visit: Payer: Self-pay | Admitting: Nurse Practitioner

## 2023-05-22 DIAGNOSIS — I10 Essential (primary) hypertension: Secondary | ICD-10-CM

## 2023-05-22 MED ORDER — METOPROLOL SUCCINATE ER 25 MG PO TB24: 25.0000 mg | ORAL_TABLET | Freq: Two times a day (BID) | ORAL | 0 refills | Status: DC

## 2023-05-22 MED ORDER — LISINOPRIL 20 MG PO TABS: 20.0000 mg | ORAL_TABLET | Freq: Every day | ORAL | 0 refills | Status: DC

## 2023-05-22 MED ORDER — HYDRALAZINE HCL 10 MG PO TABS: 10.0000 mg | ORAL_TABLET | Freq: Three times a day (TID) | ORAL | 0 refills | Status: DC | PRN

## 2023-05-22 MED ORDER — ATORVASTATIN CALCIUM 80 MG PO TABS: 80.0000 mg | ORAL_TABLET | Freq: Every day | ORAL | 1 refills | Status: DC

## 2023-05-22 NOTE — Telephone Encounter (Signed)
Already ordered today in a separate encounter for all except amlodipine 5 mg (dosage increased to 10 mg) and glipizide 5 mg (dose increased to 10 mg), Hydralazine 10 mg (dosage increased to 20 mg).  Requested Prescriptions  Pending Prescriptions Disp Refills   amLODipine (NORVASC) 5 MG tablet [Pharmacy Med Name: AMLODIPINE BESYLATE 5 MG TAB] 30 tablet 0    Sig: TAKE 1 TABLET (5 MG TOTAL) BY MOUTH DAILY.     Cardiovascular: Calcium Channel Blockers 2 Failed - 05/22/2023  1:56 PM      Failed - Last BP in normal range    BP Readings from Last 1 Encounters:  05/20/23 (!) 154/74         Passed - Last Heart Rate in normal range    Pulse Readings from Last 1 Encounters:  05/20/23 90         Passed - Valid encounter within last 6 months    Recent Outpatient Visits           2 days ago Essential hypertension   [facility] Berniece Salines, FNP   2 weeks ago Essential hypertension   [facility] Bernie[facility] Salines, FNP   1 month ago Essential hypertension   [facility] Salines, FNP   1 month ago Essential hypertension   [facility] Berniece Salines, FNP   2 months ago Allergic sinusitis   [facility] Salines, FNP       Future Appointments             In 2 weeks Berniece Salines, FNP [facility], [facility]   In 1 month Yates Decamp, MD [facility], [facility]   In 2 months Zane Herald, Rudolpho Sevin, FNP [facility], [facility]             lisinopril (ZESTRIL) 20 MG tablet [Pharmacy Med Name: LISINOPRIL 20 MG TABLET] 30 tablet 0    Sig: TAKE 1 TABLET BY MOUTH EVERY DAY     Cardiovascular:  ACE Inhibitors Failed - 05/22/2023  1:56 PM      Failed - Last BP in normal range    BP Readings from Last 1 Encounters:  05/20/23 (!) 154/74         Passed - Cr in normal range and within  180 days    Creat  Date Value Ref Range Status  05/02/2023 0.68 0.50 - 1.05 mg/dL Final   Creatinine, Urine  Date Value Ref Range Status  11/07/2022 71 20 - 275 mg/dL Final         Passed - K in normal range and within 180 days    Potassium  Date Value Ref Range Status  05/02/2023 4.1 3.5 - 5.3 mmol/L Final         Passed - Patient is not pregnant      Passed - Valid encounter within last 6 months    Recent Outpatient Visits           2 days ago Essential hypertension   [facility] Berniece Salines, FNP   2 weeks ago Essential hypertension   [facility] Berniece Salines, FNP   1 month ago Essential hypertension   [facility]ines, FNP   1 month ago Essential hypertension   [facility] Della Goo F, FNP   2 months ago Allergic sinusitis   [facility] Berniece Salines, FNP       Future Appointments             In 2 weeks Zane Herald Rudolpho Sevin, FNP [facility], [facility]   In 1 month Yates Decamp, MD [facility], [facility]   In 2 months Zane Herald, Rudolpho Sevin, FNP [facility], [facility]             glipiZIDE (GLUCOTROL XL) 5 MG 24 hr tablet [Pharmacy Med Name: GLIPIZIDE ER 5 MG TABLET] 90 tablet 0    Sig: TAKE 1 TABLET BY MOUTH EVERY DAY WITH BREAKFAST     Endocrinology:  Diabetes - Sulfonylureas Failed - 05/22/2023  1:56 PM      Failed - HBA1C is between 0 and 7.9 and within 180 days    Hgb A1c MFr Bld  Date Value Ref Range Status  05/02/2023 8.6 (H) <5.7 % of total Hgb Final    Comment:    For someone without known diabetes, a hemoglobin A1c value of 6.5% or greater indicates that they may have  diabetes and this should be confirmed with a follow-up  test. . For someone with known diabetes, a value <7% indicates  that their diabetes is well controlled and a  value  greater than or equal to 7% indicates suboptimal  control. A1c targets should be individualized based on  duration of diabetes, age, comorbid conditions, and  other considerations. . Currently, no consensus exists regarding use of hemoglobin A1c for diagnosis of diabetes for children. .          Passed - Cr in normal range and within 360 days    Creat  Date Value Ref Range Status  05/02/2023 0.68 0.50 - 1.05 mg/dL Final   Creatinine, Urine  Date Value Ref Range Status  11/07/2022 71 20 - 275 mg/dL Final         Passed - Valid encounter within last 6 months    Recent Outpatient Visits           2 days ago Essential hypertension   [facility] Berniece Salines, FNP   2 weeks ago Essential hypertension   [facility]ines, FNP   1 month ago Essential hypertension   [facility] Salines, FNP   1 month ago Essential hypertension   [facility] Bernie[facility] Salines, FNP   2 months ago Allergic sinusitis   [facility] Berniece Salines, FNP       Future Appointments             In 2 weeks Zane Herald Rudolpho Sevin, FNP [facility], [facility]   In 1 month Yates Decamp, MD [facility]   In 2 months Zane Herald, Rudolpho Sevin, FNP [facility], [facility]             metFORMIN (GLUCOPHAGE-XR) 500 MG 24 hr tablet [Pharmacy Med Name: METFORMIN HCL ER 500 MG TABLET] 180 tablet 1    Sig: TAKE 1 TABLET BY MOUTH 2 TIMES DAILY WITH A MEAL.     Endocrinology:  Diabetes - Biguanides Failed - 05/22/2023  1:56 PM      Failed - HBA1C is between 0  and 7.9 and within 180 days    Hgb A1c MFr Bld  Date Value Ref Range Status  05/02/2023 8.6 (H) <5.7 % of total Hgb Final    Comment:    For someone without known diabetes, a hemoglobin A1c value of 6.5% or greater indicates that  they may have  diabetes and this should be confirmed with a follow-up  test. . For someone with known diabetes, a value <7% indicates  that their diabetes is well controlled and a value  greater than or equal to 7% indicates suboptimal  control. A1c targets should be individualized based on  duration of diabetes, age, comorbid conditions, and  other considerations. . Currently, no consensus exists regarding use of hemoglobin A1c for diagnosis of diabetes for children. .          Failed - B12 Level in normal range and within 720 days    No results found for: "VITAMINB12"       Passed - Cr in normal range and within 360 days    Creat  Date Value Ref Range Status  05/02/2023 0.68 0.50 - 1.05 mg/dL Final   Creatinine, Urine  Date Value Ref Range Status  11/07/2022 71 20 - 275 mg/dL Final         Passed - eGFR in normal range and within 360 days    GFR calc Af Amer  Date Value Ref Range Status  05/05/2009  >60 mL/min Final   >60        The eGFR has been calculated using the MDRD equation. This calculation has not been validated in all clinical situations. eGFR's persistently <60 mL/min signify possible Chronic Kidney Disease.   GFR, Estimated  Date Value Ref Range Status  10/14/2022 >60 >60 mL/min Final    Comment:    (NOTE) Calculated using the CKD-EPI Creatinine Equation (2021)    eGFR  Date Value Ref Range Status  05/02/2023 95 > OR = 60 mL/min/1.14m2 Final         Passed - Valid encounter within last 6 months    Recent Outpatient Visits           2 days ago Essential hypertension   [facility] Berniece Salines, FNP   2 weeks ago Essential hypertension   [facility] Salines, FNP   1 month ago Essential hypertension   [facility] Bernie[facility] Salines, FNP   1 month ago Essential hypertension   [facility] Berniece Salines, FNP   2 months ago  Allergic sinusitis   [facility] Bernie[facility] Salines, FNP       Future Appointments             In 2 weeks Zane Herald Rudolpho Sevin, FNP [facility], [facility]   In 1 month Yates Decamp, MD [facility], [facility]   In 2 months Zane Herald, Rudolpho Sevin, FNP [facility], [facility]            Passed - CBC within normal limits and completed in the last 12 months    WBC  Date Value Ref Range Status  05/02/2023 5.7 3.8 - 10.8 Thousand/uL Final   RBC  Date Value Ref Range Status  05/02/2023 4.30 3.80 - 5.10 Million/uL Final   Hemoglobin  Date Value Ref Range Status  05/02/2023 13.6 11.7 - 15.5 g/dL Final  HCT  Date Value Ref Range Status  05/02/2023 39.9 35.0 - 45.0 % Final   MCHC  Date Value Ref Range Status  05/02/2023 34.1 32.0 - 36.0 g/dL Final    Comment:    For adults, a slight decrease in the calculated MCHC value (in the range of 30 to 32 g/dL) is most likely not clinically significant; however, it should be interpreted with caution in correlation with other red cell parameters and the patient's clinical condition.    [facility]  Date Value Ref Range Status  05/02/2023 31.6 27.0 - 33.0 pg Final   MCV  Date Value Ref Range Status  05/02/2023 92.8 80.0 - 100.0 fL Final   No results found for: "PLTCOUNTKUC", "LABPLAT", "POCPLA" RDW  Date Value Ref Range Status  05/02/2023 11.8 11.0 - 15.0 % Final          hydrALAZINE (APRESOLINE) 10 MG tablet [Pharmacy Med Name: HYDRALAZINE 10 MG TABLET] 90 tablet 0    Sig: TAKE 1 TABLET 3 TIMES DAILY AS NEEDED (TAKE WHEN BLOOD PRESSURE GREATER THAN 160/90).     Cardiovascular:  Vasodilators Failed - 05/22/2023  1:56 PM      Failed - ANA Screen, Ifa, Serum in normal range and within 360 days    No results found for: "ANA", "ANATITER", "LABANTI"       Failed - Last BP in normal range    BP Readings from Last 1 Encounters:  05/20/23 (!) 154/74          Passed - HCT in normal range and within 360 days    HCT  Date Value Ref Range Status  05/02/2023 39.9 35.0 - 45.0 % Final         Passed - HGB in normal range and within 360 days    Hemoglobin  Date Value Ref Range Status  05/02/2023 13.6 11.7 - 15.5 g/dL Final         Passed - RBC in normal range and within 360 days    RBC  Date Value Ref Range Status  05/02/2023 4.30 3.80 - 5.10 Million/uL Final         Passed - WBC in normal range and within 360 days    WBC  Date Value Ref Range Status  05/02/2023 5.7 3.8 - 10.8 Thousand/uL Final         Passed - PLT in normal range and within 360 days    Platelets  Date Value Ref Range Status  05/02/2023 262 140 - 400 Thousand/uL Final         Passed - Valid encounter within last 12 months    Recent Outpatient Visits           2 days ago Essential hypertension   [facility] Berniece Salines, FNP   2 weeks ago Essential hypertension   [facility] Salines, FNP   1 month ago Essential hypertension   [facility] Berniece Salines, FNP   1 month ago Essential hypertension   [facility] Berniece Salines, FNP   2 months ago Allergic sinusitis   [facility] Bernie[facility] Salines, FNP       Future Appointments             In 2 weeks Zane Herald, Rudolpho Sevin, FNP [facility], [facility]   In 1 month Yates Decamp, MD [facility], [facility]  In 2 months Zane Herald, Rudolpho Sevin, FNP [facility], [facility]

## 2023-05-27 DIAGNOSIS — I639 Cerebral infarction, unspecified: Secondary | ICD-10-CM | POA: Diagnosis not present

## 2023-05-27 DIAGNOSIS — E1169 Type 2 diabetes mellitus with other specified complication: Secondary | ICD-10-CM | POA: Diagnosis not present

## 2023-05-27 DIAGNOSIS — E785 Hyperlipidemia, unspecified: Secondary | ICD-10-CM | POA: Diagnosis not present

## 2023-05-27 DIAGNOSIS — I10 Essential (primary) hypertension: Secondary | ICD-10-CM | POA: Diagnosis not present

## 2023-05-27 DIAGNOSIS — Z7189 Other specified counseling: Secondary | ICD-10-CM | POA: Diagnosis not present

## 2023-05-27 DIAGNOSIS — R030 Elevated blood-pressure reading, without diagnosis of hypertension: Secondary | ICD-10-CM | POA: Diagnosis not present

## 2023-05-27 DIAGNOSIS — G8191 Hemiplegia, unspecified affecting right dominant side: Secondary | ICD-10-CM | POA: Diagnosis not present

## 2023-05-27 DIAGNOSIS — Z8673 Personal history of transient ischemic attack (TIA), and cerebral infarction without residual deficits: Secondary | ICD-10-CM | POA: Diagnosis not present

## 2023-05-30 ENCOUNTER — Ambulatory Visit: Payer: PPO | Admitting: Nurse Practitioner

## 2023-06-09 NOTE — Progress Notes (Unsigned)
There were no vitals taken for this visit.   Subjective:    Patient ID: Kathleen Hickman, female    DOB: 1956/02/23, 68 y.o.   MRN: [id number]  HPI: Kathleen Hickman is a 68 y.o. female  No chief complaint on file.   Discussed the use of AI scribe software for clinical note transcription with the patient, who gave verbal consent to proceed.  History of Present Illness           05/20/2023    8:07 AM 04/18/2023    8:11 AM 03/27/2023    8:19 AM  Depression screen PHQ 2/9  Decreased Interest 1 0 0  Down, Depressed, Hopeless 1 0 1  PHQ - 2 Score 2 0 1  Altered sleeping 1 0 1  Tired, decreased energy 1 0 1  Change in appetite 0 0 0  Feeling bad or failure about yourself  0 0 0  Trouble concentrating 0 0 0  Moving slowly or fidgety/restless 0 0 0  Suicidal thoughts 0 0 0  PHQ-9 Score 4 0 3  Difficult doing work/chores Somewhat difficult Not difficult at all Very difficult    Relevant past medical, surgical, family and social history reviewed and updated as indicated. Interim medical history since our last visit reviewed. Allergies and medications reviewed and updated.  Review of Systems  Per HPI unless specifically indicated above     Objective:    There were no vitals taken for this visit.  {Vitals History (Optional):23777} Wt Readings from Last 3 Encounters:  05/20/23 177 lb 9.6 oz (80.6 kg)  05/02/23 175 lb (79.4 kg)  04/18/23 175 lb 8 oz (79.6 kg)    Physical Exam  Results for orders placed or performed in visit on 05/02/23  CBC with Differential/Platelet   Collection Time: 05/02/23  8:58 AM  Result Value Ref Range   WBC 5.7 3.8 - 10.8 Thousand/uL   RBC 4.30 3.80 - 5.10 Million/uL   Hemoglobin 13.6 11.7 - 15.5 g/dL   HCT 10.2 72.5 - 36.6 %   MCV 92.8 80.0 - 100.0 fL   MCH 31.6 27.0 - 33.0 pg   MCHC 34.1 32.0 - 36.0 g/dL   RDW 44.0 34.7 - 42.5 %   Platelets 262 140 - 400 Thousand/uL   MPV 11.0 7.5 - 12.5 fL   Neutro Abs 3,403 1,500 - 7,800 cells/uL    Absolute Lymphocytes 1,642 850 - 3,900 cells/uL   Absolute Monocytes 456 200 - 950 cells/uL   Eosinophils Absolute 171 15 - 500 cells/uL   Basophils Absolute 29 0 - 200 cells/uL   Neutrophils Relative % 59.7 %   Total Lymphocyte 28.8 %   Monocytes Relative 8.0 %   Eosinophils Relative 3.0 %   Basophils Relative 0.5 %  COMPLETE METABOLIC PANEL WITH GFR   Collection Time: 05/02/23  8:58 AM  Result Value Ref Range   Glucose, Bld 324 (H) 65 - 99 mg/dL   BUN 14 7 - 25 mg/dL   Creat 9.56 3.87 - 5.64 mg/dL   eGFR 95 > OR = 60 PP/IRJ/1.88C1   BUN/Creatinine Ratio SEE NOTE: 6 - 22 (calc)   Sodium 138 135 - 146 mmol/L   Potassium 4.1 3.5 - 5.3 mmol/L   Chloride 103 98 - 110 mmol/L   CO2 26 20 - 32 mmol/L   Calcium 9.6 8.6 - 10.4 mg/dL   Total Protein 6.7 6.1 - 8.1 g/dL   Albumin 4.8 3.6 - 5.1 g/dL  Globulin 1.9 1.9 - 3.7 g/dL (calc)   AG Ratio 2.5 1.0 - 2.5 (calc)   Total Bilirubin 0.5 0.2 - 1.2 mg/dL   Alkaline phosphatase (APISO) 60 37 - 153 U/L   AST 18 10 - 35 U/L   ALT 24 6 - 29 U/L  Lipid panel   Collection Time: 05/02/23  8:58 AM  Result Value Ref Range   Cholesterol 111 <200 mg/dL   HDL 50 > OR = 50 mg/dL   Triglycerides 93 <601 mg/dL   LDL Cholesterol (Calc) 43 mg/dL (calc)   Total CHOL/HDL Ratio 2.2 <5.0 (calc)   Non-HDL Cholesterol (Calc) 61 <093 mg/dL (calc)  Hemoglobin A3F   Collection Time: 05/02/23  8:58 AM  Result Value Ref Range   Hgb A1c MFr Bld 8.6 (H) <5.7 % of total Hgb   Mean Plasma Glucose 200 mg/dL   eAG (mmol/L) 57.3 mmol/L   {Labs (Optional):23779}    Assessment & Plan:   Problem List Items Addressed This Visit   None    Assessment and Plan             Follow up plan: No follow-ups on file.

## 2023-06-10 ENCOUNTER — Other Ambulatory Visit: Payer: Self-pay

## 2023-06-10 ENCOUNTER — Encounter: Payer: Self-pay | Admitting: Nurse Practitioner

## 2023-06-10 ENCOUNTER — Ambulatory Visit (INDEPENDENT_AMBULATORY_CARE_PROVIDER_SITE_OTHER): Payer: PPO | Admitting: Nurse Practitioner

## 2023-06-10 VITALS — BP 128/76 | HR 84 | Temp 98.2°F | Resp 16 | Ht <= 58 in | Wt 178.7 lb

## 2023-06-10 DIAGNOSIS — E782 Mixed hyperlipidemia: Secondary | ICD-10-CM

## 2023-06-10 DIAGNOSIS — I10 Essential (primary) hypertension: Secondary | ICD-10-CM | POA: Diagnosis not present

## 2023-06-10 DIAGNOSIS — E1165 Type 2 diabetes mellitus with hyperglycemia: Secondary | ICD-10-CM

## 2023-06-10 DIAGNOSIS — Z7984 Long term (current) use of oral hypoglycemic drugs: Secondary | ICD-10-CM | POA: Diagnosis not present

## 2023-06-10 DIAGNOSIS — Z8673 Personal history of transient ischemic attack (TIA), and cerebral infarction without residual deficits: Secondary | ICD-10-CM

## 2023-06-10 MED ORDER — METOPROLOL SUCCINATE ER 25 MG PO TB24: 25.0000 mg | ORAL_TABLET | Freq: Two times a day (BID) | ORAL | 1 refills | Status: DC

## 2023-06-10 MED ORDER — ATORVASTATIN CALCIUM 80 MG PO TABS: 80.0000 mg | ORAL_TABLET | Freq: Every day | ORAL | 1 refills | Status: DC

## 2023-06-10 MED ORDER — LISINOPRIL 20 MG PO TABS
20.0000 mg | ORAL_TABLET | Freq: Every day | ORAL | 1 refills | Status: DC
Start: 2023-06-10 — End: 2023-12-02

## 2023-06-10 MED ORDER — AMLODIPINE BESYLATE 10 MG PO TABS
10.0000 mg | ORAL_TABLET | Freq: Every day | ORAL | 1 refills | Status: DC
Start: 2023-06-10 — End: 2023-12-02

## 2023-06-10 MED ORDER — OZEMPIC (0.25 OR 0.5 MG/DOSE) 2 MG/3ML ~~LOC~~ SOPN: 0.5000 mg | PEN_INJECTOR | SUBCUTANEOUS | 0 refills | Status: DC

## 2023-06-10 MED ORDER — CLOPIDOGREL BISULFATE 75 MG PO TABS: 75.0000 mg | ORAL_TABLET | Freq: Every day | ORAL | 3 refills | Status: DC

## 2023-06-14 ENCOUNTER — Other Ambulatory Visit: Payer: Self-pay | Admitting: Nurse Practitioner

## 2023-06-14 DIAGNOSIS — I10 Essential (primary) hypertension: Secondary | ICD-10-CM

## 2023-06-16 NOTE — Telephone Encounter (Signed)
 D/C 04/18/23. Requested Prescriptions  Refused Prescriptions Disp Refills   amLODipine (NORVASC) 5 MG tablet [Pharmacy Med Name: AMLODIPINE BESYLATE 5 MG TAB] 30 tablet 0    Sig: TAKE 1 TABLET (5 MG TOTAL) BY MOUTH DAILY.     Cardiovascular: Calcium Channel Blockers 2 Passed - 06/16/2023  3:06 PM      Passed - Last BP in normal range    BP Readings from Last 1 Encounters:  06/10/23 128/76         Passed - Last Heart Rate in normal range    Pulse Readings from Last 1 Encounters:  06/10/23 84         Passed - Valid encounter within last 6 months    Recent Outpatient Visits           3 weeks ago Essential hypertension   [facility] Berniece Salines, FNP   1 month ago Essential hypertension   [facility] Berniece Salines, FNP   1 month ago Essential hypertension   [facility] Berniece Salines, FNP   2 months ago Essential hypertension   [facility] Berniece Salines, FNP   3 months ago Allergic sinusitis   [facility] Berniece Salines, FNP       Future Appointments             In 3 weeks Yates Decamp, MD [facility], [facility]   In 1 month Zane Herald, Rudolpho Sevin, FNP [facility], [facility]

## 2023-06-20 ENCOUNTER — Other Ambulatory Visit: Payer: Self-pay | Admitting: Nurse Practitioner

## 2023-06-20 DIAGNOSIS — E1165 Type 2 diabetes mellitus with hyperglycemia: Secondary | ICD-10-CM

## 2023-06-20 DIAGNOSIS — J309 Allergic rhinitis, unspecified: Secondary | ICD-10-CM

## 2023-06-20 NOTE — Telephone Encounter (Signed)
 Copied from CRM [id number]. Topic: Clinical - Medication Refill >> Jun 20, 2023  4:33 PM Louie Casa B wrote: Most Recent Primary Care Visit:  Provider: Della Goo F  Department[facility]  Visit Type: OFFICE VISIT  Date: 05/20/2023  Medication: metFORMIN (GLUCOPHAGE-XR) 500 MG 24 hr tablet [[id number]]  Has the patient contacted their pharmacy? Yes (Agent: If no, request that the patient contact the pharmacy for the refill. If patient does not wish to contact the pharmacy document the reason why and proceed with request.) (Agent: If yes, when and what did the pharmacy advise?)  Is this the correct pharmacy for this prescription? No If no, delete pharmacy and type the correct one.  This is the patient's preferred pharmacy:  [id number] [place], [place] - [address] [place] [address] Phone: [phone] Fax: [phone]   Has the prescription been filled recently? Yes  Is the patient out of the medication? Yes  Has the patient been seen for an appointment in the last year OR does the patient have an upcoming appointment? Yes  Can we respond through MyChart? No  Agent: Please be advised that Rx refills may take up to 3 business days. We ask that you follow-up with your pharmacy.

## 2023-06-23 NOTE — Telephone Encounter (Signed)
 Requested Prescriptions  Pending Prescriptions Disp Refills   fluticasone (FLONASE) 50 MCG/ACT nasal spray [Pharmacy Med Name: FLUTICASONE PROP 50 MCG SPRAY] 16 g 0    Sig: SPRAY 2 SPRAYS INTO EACH NOSTRIL EVERY DAY     Ear, Nose, and Throat: Nasal Preparations - Corticosteroids Passed - 06/23/2023  8:46 AM      Passed - Valid encounter within last 12 months    Recent Outpatient Visits           1 month ago Essential hypertension   [facility] Berniece Salines, FNP   1 month ago Essential hypertension   [facility] Berniece Salines, FNP   2 months ago Essential hypertension   [facility] Berniece Salines, FNP   2 months ago Essential hypertension   [facility] Berniece Salines, FNP   3 months ago Allergic sinusitis   [facility] Berniece Salines, FNP       Future Appointments             In 1 month Zane Herald, Rudolpho Sevin, FNP [facility], [facility]   In 2 months Debbe Odea, MD [facility]             glipiZIDE (GLUCOTROL XL) 5 MG 24 hr tablet [Pharmacy Med Name: GLIPIZIDE ER 5 MG TABLET] 90 tablet 0    Sig: TAKE 1 TABLET BY MOUTH EVERY DAY WITH BREAKFAST     Endocrinology:  Diabetes - Sulfonylureas Failed - 06/23/2023  8:46 AM      Failed - HBA1C is between 0 and 7.9 and within 180 days    Hgb A1c MFr Bld  Date Value Ref Range Status  05/02/2023 8.6 (H) <5.7 % of total Hgb Final    Comment:    For someone without known diabetes, a hemoglobin A1c value of 6.5% or greater indicates that they may have  diabetes and this should be confirmed with a follow-up  test. . For someone with known diabetes, a value <7% indicates  that their diabetes is well controlled and a value  greater than or equal to 7% indicates suboptimal  control. A1c targets should be individualized based on  duration of diabetes,  age, comorbid conditions, and  other considerations. . Currently, no consensus exists regarding use of hemoglobin A1c for diagnosis of diabetes for children. .          Passed - Cr in normal range and within 360 days    Creat  Date Value Ref Range Status  05/02/2023 0.68 0.50 - 1.05 mg/dL Final   Creatinine, Urine  Date Value Ref Range Status  11/07/2022 71 20 - 275 mg/dL Final         Passed - Valid encounter within last 6 months    Recent Outpatient Visits           1 month ago Essential hypertension   [facility] Berniece Salines, FNP   1 month ago Essential hypertension   [facility] Berniece Salines, FNP   2 months ago Essential hypertension   [facility] Berniece Salines, FNP   2 months ago Essential hypertension   [facility] Berniece Salines, FNP   3 months ago Allergic sinusitis   [facility] Berniece Salines, [place]  Future Appointments             In 1 month Pender, Rudolpho Sevin, FNP [facility], [facility]   In 2 months Agbor-Etang, Arlys John, MD [facility] at [facility]

## 2023-06-25 ENCOUNTER — Other Ambulatory Visit: Payer: Self-pay | Admitting: Nurse Practitioner

## 2023-06-26 NOTE — Telephone Encounter (Signed)
 Medication no longer listed on current medication list Requested Prescriptions  Pending Prescriptions Disp Refills   glipiZIDE (GLUCOTROL XL) 2.5 MG 24 hr tablet [Pharmacy Med Name: GLIPIZIDE ER 2.5 MG TABLET] 90 tablet 2    Sig: TAKE 1 TABLET BY MOUTH EVERY DAY     Endocrinology:  Diabetes - Sulfonylureas Failed - 06/26/2023  1:34 PM      Failed - HBA1C is between 0 and 7.9 and within 180 days    Hgb A1c MFr Bld  Date Value Ref Range Status  05/02/2023 8.6 (H) <5.7 % of total Hgb Final    Comment:    For someone without known diabetes, a hemoglobin A1c value of 6.5% or greater indicates that they may have  diabetes and this should be confirmed with a follow-up  test. . For someone with known diabetes, a value <7% indicates  that their diabetes is well controlled and a value  greater than or equal to 7% indicates suboptimal  control. A1c targets should be individualized based on  duration of diabetes, age, comorbid conditions, and  other considerations. . Currently, no consensus exists regarding use of hemoglobin A1c for diagnosis of diabetes for children. .          Passed - Cr in normal range and within 360 days    Creat  Date Value Ref Range Status  05/02/2023 0.68 0.50 - 1.05 mg/dL Final   Creatinine, Urine  Date Value Ref Range Status  11/07/2022 71 20 - 275 mg/dL Final         Passed - Valid encounter within last 6 months    Recent Outpatient Visits           1 month ago Essential hypertension   [facility] Berniece Salines, FNP   1 month ago Essential hypertension   [facility] Berniece Salines, FNP   2 months ago Essential hypertension   [facility] Berniece Salines, FNP   3 months ago Essential hypertension   [facility] Berniece Salines, FNP   3 months ago Allergic sinusitis   [facility] Berniece Salines, FNP       Future  Appointments             In 1 month Zane Herald, Rudolpho Sevin, FNP [facility], [facility]   In 2 months Debbe Odea, MD [facility] at [facility]

## 2023-06-27 ENCOUNTER — Other Ambulatory Visit: Payer: Self-pay | Admitting: Emergency Medicine

## 2023-06-27 ENCOUNTER — Other Ambulatory Visit: Payer: Self-pay | Admitting: Nurse Practitioner

## 2023-06-27 DIAGNOSIS — E1165 Type 2 diabetes mellitus with hyperglycemia: Secondary | ICD-10-CM

## 2023-06-27 MED ORDER — METFORMIN HCL ER 500 MG PO TB24: 500.0000 mg | ORAL_TABLET | Freq: Two times a day (BID) | ORAL | 1 refills | Status: DC

## 2023-07-09 ENCOUNTER — Other Ambulatory Visit: Payer: Self-pay | Admitting: Nurse Practitioner

## 2023-07-09 DIAGNOSIS — E1165 Type 2 diabetes mellitus with hyperglycemia: Secondary | ICD-10-CM

## 2023-07-10 NOTE — Telephone Encounter (Signed)
 Requested Prescriptions  Pending Prescriptions Disp Refills   Semaglutide,0.25 or 0.5MG /DOS, (OZEMPIC, 0.25 OR 0.5 MG/DOSE,) 2 MG/3ML SOPN [Pharmacy Med Name: OZEMPIC 0.25-0.5 MG/DOSE PEN] 3 mL 0    Sig: INJECT 0.5 MG INTO THE SKIN ONE TIME PER WEEK     Endocrinology:  Diabetes - GLP-1 Receptor Agonists - semaglutide Failed - 07/10/2023  9:56 AM      Failed - HBA1C in normal range and within 180 days    Hgb A1c MFr Bld  Date Value Ref Range Status  05/02/2023 8.6 (H) <5.7 % of total Hgb Final    Comment:    For someone without known diabetes, a hemoglobin A1c value of 6.5% or greater indicates that they may have  diabetes and this should be confirmed with a follow-up  test. . For someone with known diabetes, a value <7% indicates  that their diabetes is well controlled and a value  greater than or equal to 7% indicates suboptimal  control. A1c targets should be individualized based on  duration of diabetes, age, comorbid conditions, and  other considerations. . Currently, no consensus exists regarding use of hemoglobin A1c for diagnosis of diabetes for children. .          Passed - Cr in normal range and within 360 days    Creat  Date Value Ref Range Status  05/02/2023 0.68 0.50 - 1.05 mg/dL Final   Creatinine, Urine  Date Value Ref Range Status  11/07/2022 71 20 - 275 mg/dL Final         Passed - Valid encounter within last 6 months    Recent Outpatient Visits           1 month ago Essential hypertension   [facility] Berniece Salines, FNP   2 months ago Essential hypertension   [facility] Berniece Salines, FNP   2 months ago Essential hypertension   [facility] Berniece Salines, FNP   3 months ago Essential hypertension   [facility] Berniece Salines, FNP   3 months ago Allergic sinusitis   [facility] Berniece Salines, FNP        Future Appointments             In 1 month Zane Herald, Rudolpho Sevin, FNP [facility]   In 2 months Debbe Odea, MD [facility]

## 2023-07-11 ENCOUNTER — Ambulatory Visit: Payer: PPO | Admitting: Cardiology

## 2023-07-28 DIAGNOSIS — H2513 Age-related nuclear cataract, bilateral: Secondary | ICD-10-CM | POA: Diagnosis not present

## 2023-07-28 DIAGNOSIS — H401111 Primary open-angle glaucoma, right eye, mild stage: Secondary | ICD-10-CM | POA: Diagnosis not present

## 2023-07-28 DIAGNOSIS — H401122 Primary open-angle glaucoma, left eye, moderate stage: Secondary | ICD-10-CM | POA: Diagnosis not present

## 2023-08-12 ENCOUNTER — Ambulatory Visit (INDEPENDENT_AMBULATORY_CARE_PROVIDER_SITE_OTHER): Payer: Self-pay | Admitting: Nurse Practitioner

## 2023-08-12 ENCOUNTER — Encounter: Payer: Self-pay | Admitting: Nurse Practitioner

## 2023-08-12 VITALS — BP 134/76 | HR 75 | Temp 98.1°F | Resp 16 | Ht <= 58 in | Wt 173.7 lb

## 2023-08-12 DIAGNOSIS — I10 Essential (primary) hypertension: Secondary | ICD-10-CM | POA: Diagnosis not present

## 2023-08-12 DIAGNOSIS — Z7984 Long term (current) use of oral hypoglycemic drugs: Secondary | ICD-10-CM | POA: Diagnosis not present

## 2023-08-12 DIAGNOSIS — Z130 Encounter for screening for diseases of the blood and blood-forming organs and certain disorders involving the immune mechanism: Secondary | ICD-10-CM

## 2023-08-12 DIAGNOSIS — E1165 Type 2 diabetes mellitus with hyperglycemia: Secondary | ICD-10-CM

## 2023-08-12 DIAGNOSIS — E669 Obesity, unspecified: Secondary | ICD-10-CM

## 2023-08-12 DIAGNOSIS — Z8673 Personal history of transient ischemic attack (TIA), and cerebral infarction without residual deficits: Secondary | ICD-10-CM

## 2023-08-12 DIAGNOSIS — E782 Mixed hyperlipidemia: Secondary | ICD-10-CM | POA: Diagnosis not present

## 2023-08-12 LAB — POCT GLYCOSYLATED HEMOGLOBIN (HGB A1C): Hemoglobin A1C: 7 % — AB (ref 4.0–5.6)

## 2023-08-12 MED ORDER — HYDRALAZINE HCL 10 MG PO TABS: 10.0000 mg | ORAL_TABLET | Freq: Three times a day (TID) | ORAL | 0 refills | Status: DC | PRN

## 2023-08-12 NOTE — Assessment & Plan Note (Signed)
 Patient is taking atorvastatin  80 mg daily and denies side effects. Lipid panel was normal 3 months ago, will recheck labs at next visit

## 2023-08-12 NOTE — Progress Notes (Signed)
 BP 134/76   Pulse 75   Temp 98.1 F (36.7 C) (Oral)   Resp 16   Ht 4\' 10"  (1.473 m)   Wt 173 lb 11.2 oz (78.8 kg)   SpO2 97%   BMI 36.30 kg/m    Subjective:    Patient ID: Kathleen Hickman, female    DOB: 11-Apr-1956, 68 y.o.   MRN: [id number]  HPI: Kathleen Hickman is a 68 y.o. female  Chief Complaint  Patient presents with   Medical Management of Chronic Issues    Hypertension:  -Medications: amlodipine  10 mg daily, Hydralazine  10mg  TID PRN, Lisinopril  20 mg daily, metoprolol  25mg  BID -Patient is compliant with above medications and reports no side effects. -Checking BP at home (average): yes 138/77 -Highest BP at home: 170/78 -Lowest BP at home: 128/74 -Denies any SOB, CP, vision changes, LE edema or symptoms of hypotension -Diet: eats small well balanced meals every 2-3 hrs -Exercise: goes to the gym 4 days a week for 1.5 hrs  Diabetes, Type 2:  -Last A1c 8.6 on 05/02/23, down to 7.0 today -Medications: metformin  500 mg BID, Ozempic  0.5 mg weekly -Patient is compliant with the above medications and reports no side effects.  -Checking BG at home: yes -Fasting home BG: 121 -Post-prandial home BG: 182 -Highest home BG since last visit: 182 -Lowest home BG since last visit: 121 -Diet: eats small well balanced meals every 2-3 hrs -Exercise: goes to the gym 4 days a week for 1.5 hrs -Eye exam: 2 weeks ago -Foot exam: November 2024 -Microalbumin: 11/07/2022 -Statin: yes atorvastatin  80 mg daily -PNA vaccine: completed  -Denies symptoms of hypoglycemia, polyuria, polydipsia, numbness extremities, foot ulcers/trauma.   HLD/hx of TIA:  -Medications: atorvastatin  80 mg daily -Patient is compliant with above medications and reports no side effects.  -Last lipid panel:  Lipid Panel     Component Value Date/Time   CHOL 111 05/02/2023 0858   TRIG 93 05/02/2023 0858   HDL 50 05/02/2023 0858   CHOLHDL 2.2 05/02/2023 0858   VLDL 29 10/15/2022 0516   LDLCALC 43  05/02/2023 0858    Obesity:  Current weight : 173 lb BMI: 36.30 kg/m previous weight:178 lbs Treatment Tried: ozempic  0.5mg  weekly Comorbidities: HTN, HLD, DM type 2     08/12/2023    7:37 AM 05/20/2023    8:07 AM 04/18/2023    8:11 AM  Depression screen PHQ 2/9  Decreased Interest 0 1 0  Down, Depressed, Hopeless 0 1 0  PHQ - 2 Score 0 2 0  Altered sleeping 0 1 0  Tired, decreased energy 0 1 0  Change in appetite 0 0 0  Feeling bad or failure about yourself  0 0 0  Trouble concentrating 0 0 0  Moving slowly or fidgety/restless 0 0 0  Suicidal thoughts 0 0 0  PHQ-9 Score 0 4 0  Difficult doing work/chores Not difficult at all Somewhat difficult Not difficult at all    Relevant past medical, surgical, family and social history reviewed and updated as indicated. Interim medical history since our last visit reviewed. Allergies and medications reviewed and updated.  Review of Systems Constitutional: Negative for fever or weight change.  Respiratory: Negative for cough and shortness of breath.   Cardiovascular: Negative for chest pain or palpitations.  Gastrointestinal: Negative for abdominal pain, no bowel changes.  Musculoskeletal: Negative for gait problem or joint swelling.  Skin: Negative for rash.  Neurological: Negative for dizziness or headache.  No other  specific complaints in a complete review of systems (except as listed in HPI above).  Per HPI unless specifically indicated above     Objective:    BP 134/76   Pulse 75   Temp 98.1 F (36.7 C) (Oral)   Resp 16   Ht 4\' 10"  (1.473 m)   Wt 173 lb 11.2 oz (78.8 kg)   SpO2 97%   BMI 36.30 kg/m    Wt Readings from Last 3 Encounters:  08/12/23 173 lb 11.2 oz (78.8 kg)  06/10/23 178 lb 11.2 oz (81.1 kg)  05/20/23 177 lb 9.6 oz (80.6 kg)    Physical Exam Vitals reviewed.  Constitutional:      Appearance: Normal appearance.  HENT:     Head: Normocephalic.  Cardiovascular:     Rate and Rhythm: Normal rate  and regular rhythm.  Pulmonary:     Effort: Pulmonary effort is normal.     Breath sounds: Normal breath sounds.  Musculoskeletal:        General: Normal range of motion.  Skin:    General: Skin is warm and dry.  Neurological:     General: No focal deficit present.     Mental Status: She is alert and oriented to person, place, and time. Mental status is at baseline.  Psychiatric:        Mood and Affect: Mood normal.        Behavior: Behavior normal.        Thought Content: Thought content normal.        Judgment: Judgment normal.     Results for orders placed or performed in visit on 08/12/23  POCT HgB A1C   Collection Time: 08/12/23  7:42 AM  Result Value Ref Range   Hemoglobin A1C 7.0 (A) 4.0 - 5.6 %   HbA1c POC (<> result, manual entry)     HbA1c, POC (prediabetic range)     HbA1c, POC (controlled diabetic range)         Assessment & Plan:   Problem List Items Addressed This Visit       Cardiovascular and Mediastinum   Essential hypertension - Primary   Patient BP is stable today, and check BPs at home. Patient is currently taking amlodipine  10 mg daily, Hydralazine  10mg  TID PRN, Lisinopril  20 mg daily, metoprolol  25mg  BID. Patient reports not needing to use her PRN medication lately.      Relevant Medications   hydrALAZINE  (APRESOLINE ) 10 MG tablet     Endocrine   Uncontrolled type 2 diabetes mellitus with hyperglycemia, without long-term current use of insulin  (HCC)   Last A1c was 8.6 on 05/01/2022, today's A1c is 7.0. Patient is currently taking metformin  500 mg BID, Ozempic  0.5mg  and reports no side effects        Other   Obesity (BMI 30-39.9)   Patient is currently taking Ozempic  0.5 mg weekly, and reports no side effects. Patient has lost 5 lbs since last visit. Will recheck weight at next visit. Comorbidities: HTN, HLD, DM type 2      Mixed hyperlipidemia   Patient is taking atorvastatin  80 mg daily and denies side effects. Lipid panel was normal 3  months ago, will recheck labs at next visit      Relevant Medications   hydrALAZINE  (APRESOLINE ) 10 MG tablet   Hx of transient ischemic attack (TIA)   Other Visit Diagnoses       Screening for deficiency anemia  Follow up plan: Return in about 4 months (around 12/12/2023) for follow up.

## 2023-08-12 NOTE — Assessment & Plan Note (Signed)
 Last A1c was 8.6 on 05/01/2022, today's A1c is 7.0. Patient is currently taking metformin  500 mg BID, Ozempic  0.5mg  and reports no side effects

## 2023-08-12 NOTE — Assessment & Plan Note (Signed)
 Patient BP is stable today, and check BPs at home. Patient is currently taking amlodipine  10 mg daily, Hydralazine  10mg  TID PRN, Lisinopril  20 mg daily, metoprolol  25mg  BID. Patient reports not needing to use her PRN medication lately.

## 2023-08-12 NOTE — Assessment & Plan Note (Signed)
 Patient is currently taking Ozempic  0.5 mg weekly, and reports no side effects. Patient has lost 5 lbs since last visit. Will recheck weight at next visit. Comorbidities: HTN, HLD, DM type 2

## 2023-08-19 ENCOUNTER — Other Ambulatory Visit: Payer: Self-pay | Admitting: Nurse Practitioner

## 2023-08-19 DIAGNOSIS — E1165 Type 2 diabetes mellitus with hyperglycemia: Secondary | ICD-10-CM

## 2023-08-21 NOTE — Telephone Encounter (Signed)
 Requested Prescriptions  Pending Prescriptions Disp Refills   Semaglutide ,0.25 or 0.5MG /DOS, (OZEMPIC , 0.25 OR 0.5 MG/DOSE,) 2 MG/3ML SOPN [Pharmacy Med Name: OZEMPIC  0.25-0.5 MG/DOSE PEN] 3 mL 2    Sig: INJECT 0.5 MG INTO THE SKIN ONE TIME PER WEEK     Endocrinology:  Diabetes - GLP-1 Receptor Agonists - semaglutide  Failed - 08/21/2023 12:10 PM      Failed - HBA1C in normal range and within 180 days    Hemoglobin A1C  Date Value Ref Range Status  08/12/2023 7.0 (A) 4.0 - 5.6 % Final   Hgb A1c MFr Bld  Date Value Ref Range Status  05/02/2023 8.6 (H) <5.7 % of total Hgb Final    Comment:    For someone without known diabetes, a hemoglobin A1c value of 6.5% or greater indicates that they may have  diabetes and this should be confirmed with a follow-up  test. . For someone with known diabetes, a value <7% indicates  that their diabetes is well controlled and a value  greater than or equal to 7% indicates suboptimal  control. A1c targets should be individualized based on  duration of diabetes, age, comorbid conditions, and  other considerations. . Currently, no consensus exists regarding use of hemoglobin A1c for diagnosis of diabetes for children. .          Passed - Cr in normal range and within 360 days    Creat  Date Value Ref Range Status  05/02/2023 0.68 0.50 - 1.05 mg/dL Final   Creatinine, Urine  Date Value Ref Range Status  11/07/2022 71 20 - 275 mg/dL Final         Passed - Valid encounter within last 6 months    Recent Outpatient Visits           1 week ago Essential hypertension   [facility] Quinton Buckler, FNP   2 months ago Mixed hyperlipidemia   [facility] Quinton Buckler, FNP       Future Appointments             In 2 weeks Agbor-Etang, Polly Brink, MD [facility]

## 2023-09-08 ENCOUNTER — Encounter: Payer: Self-pay | Admitting: Cardiology

## 2023-09-08 ENCOUNTER — Ambulatory Visit: Payer: PPO | Attending: Cardiology | Admitting: Cardiology

## 2023-09-08 ENCOUNTER — Ambulatory Visit

## 2023-09-08 VITALS — BP 130/60 | HR 72 | Ht 59.0 in | Wt 171.2 lb

## 2023-09-08 DIAGNOSIS — G459 Transient cerebral ischemic attack, unspecified: Secondary | ICD-10-CM

## 2023-09-08 DIAGNOSIS — I10 Essential (primary) hypertension: Secondary | ICD-10-CM | POA: Diagnosis not present

## 2023-09-08 DIAGNOSIS — R072 Precordial pain: Secondary | ICD-10-CM

## 2023-09-08 DIAGNOSIS — I6521 Occlusion and stenosis of right carotid artery: Secondary | ICD-10-CM | POA: Diagnosis not present

## 2023-09-08 MED ORDER — METOPROLOL TARTRATE 100 MG PO TABS: ORAL_TABLET | ORAL | 0 refills | Status: DC

## 2023-09-08 NOTE — Patient Instructions (Addendum)
 Medication Instructions:  Your Physician recommend you continue on your current medication as directed.    *If you need a refill on your cardiac medications before your next appointment, please call your pharmacy*  Lab Work: Your provider would like for you to have following labs drawn today BMP.    If you have labs (blood work) drawn today and your tests are completely normal, you will receive your results only by: MyChart Message (if you have MyChart) OR A paper copy in the mail If you have any lab test that is abnormal or we need to change your treatment, we will call you to review the results.  Testing/Procedures:   Your cardiac CT will be scheduled at:  [facility] [address] [place], [place] [address] [phone]  Please arrive 15 mins early for check-in and test prep.  There is spacious parking and easy access to the radiology department from the [facility] entrance. Please enter here and check-in with the desk attendant.    Please follow these instructions carefully (unless otherwise directed):  An IV will be required for this test and Nitroglycerin will be given.  Hold all erectile dysfunction medications at least 3 days (72 hrs) prior to test. (Ie viagra, cialis, sildenafil, tadalafil, etc)   On the Night Before the Test: Be sure to Drink plenty of water. Do not consume any caffeinated/decaffeinated beverages or chocolate 12 hours prior to your test. Do not take any antihistamines 12 hours prior to your test.  On the Day of the Test: Drink plenty of water until 1 hour prior to the test. Do not eat any food 1 hour prior to test. You may take your regular medications prior to the test.  Take metoprolol  (Lopressor ) two hours prior to test. If you take Furosemide/Hydrochlorothiazide/Spironolactone/Chlorthalidone, please HOLD on the morning of the test. Patients who wear a continuous glucose monitor MUST remove the device  prior to scanning. FEMALES- please wear underwire-free bra if available, avoid dresses & tight clothing  After the Test: Drink plenty of water. After receiving IV contrast, you may experience a mild flushed feeling. This is normal. On occasion, you may experience a mild rash up to 24 hours after the test. This is not dangerous. If this occurs, you can take Benadryl 25 mg, Zyrtec, Claritin, or Allegra and increase your fluid intake. (Patients taking Tikosyn should avoid Benadryl, and may take Zyrtec, Claritin, or Allegra) If you experience trouble breathing, this can be serious. If it is severe call 911 IMMEDIATELY. If it is mild, please call our office.  We will call to schedule your test 2-4 weeks out understanding that some insurance companies will need an authorization prior to the service being performed.   For more information and frequently asked questions, please visit our website : [email or web address]  For non-scheduling related questions, please contact the cardiac imaging nurse navigator should you have any questions/concerns: Cardiac Imaging Nurse Navigators Direct Office Dial: [phone]   For scheduling needs, including cancellations and rescheduling, please call [place], [phone].    Your physician has recommended that you wear a Zio monitor.   This monitor is a medical device that records the heart's electrical activity. Doctors most often use these monitors to diagnose arrhythmias. Arrhythmias are problems with the speed or rhythm of the heartbeat. The monitor is a small device applied to your chest. You can wear one while you do your normal daily activities. While wearing this monitor if you have any symptoms to  push the button and record what you felt. Once you have worn this monitor for the period of time provider prescribed (Usually 14 days), you will return the monitor device in the postage paid box. Once it is returned they will download the data  collected and provide us  with a report which the provider will then review and we will call you with those results. Important tips:  Avoid showering during the first 24 hours of wearing the monitor. Avoid excessive sweating to help maximize wear time. Do not submerge the device, no hot tubs, and no swimming pools. Keep any lotions or oils away from the patch. After 24 hours you may shower with the patch on. Take brief showers with your back facing the shower head.  Do not remove patch once it has been placed because that will interrupt data and decrease adhesive wear time. Push the button when you have any symptoms and write down what you were feeling. Once you have completed wearing your monitor, remove and place into box which has postage paid and place in your outgoing mailbox.  If for some reason you have misplaced your box then call our office and we can provide another box and/or mail it off for you.   Follow-Up: At [facility], you and your health needs are our priority.  As part of our continuing mission to provide you with exceptional heart care, our providers are all part of one team.  This team includes your primary Cardiologist (physician) and Advanced Practice Providers or APPs (Physician Assistants and Nurse Practitioners) who all work together to provide you with the care you need, when you need it.  Your next appointment:   3 month(s)  Provider:   You may see Dr. Junnie Olives or one of the following Advanced Practice Providers on your designated Care Team:   Laneta Pintos, NP Gildardo Labrador, PA-C Varney Gentleman, PA-C Cadence [place], PA-C Ronald Cockayne, NP Morey Ar, NP    We recommend signing up for the patient portal called "MyChart".  Sign up information is provided on this After Visit Summary.  MyChart is used to connect with patients for Virtual Visits (Telemedicine).  Patients are able to view lab/test results, encounter notes, upcoming appointments, etc.   Non-urgent messages can be sent to your provider as well.   To learn more about what you can do with MyChart, go to [email or web address].

## 2023-09-08 NOTE — Progress Notes (Signed)
 Cardiology Office Note:    Date:  09/08/2023   ID:  RASHAWNDA GABA, DOB [birth year], MRN [id number]  PCP:  Quinton Buckler, FNP   [facility] Providers Cardiologist:  None     Referring MD: Quinton Buckler, FNP   No chief complaint on file.  Kathleen Hickman is a 68 y.o. female who is being seen today for the evaluation of CAD, cardiac risk at the request of Quinton Buckler, FNP.   History of Present Illness:    Kathleen Hickman is a 68 y.o. female with a hx of hypertension, hyperlipidemia, diabetes, TIA who presents due to strong family history of CAD, TIA.  Patient has occasional epigastric pain, attributes this to possible reflux.  Has a strong family history of CAD, father had coronary artery bypass in his 31s-50s, daughter had stent placement in her 50s, brother had an MI in his 59s.  Patient was admitted last year June/2020 for with symptoms of right arm numbness, diagnosed with a TIA.  CT angio of head and neck area revealed intracranial artery stenosis, moderate right previously noted ICA stenosis.  Started on aspirin , Plavix , Lipitor which patient is compliant with.  BP was previously elevated, now controlled.  Diabetes control also improving.  Past Medical History:  Diagnosis Date   Diabetes mellitus without complication (HCC)    Hyperlipidemia    Hypertension     Past Surgical History:  Procedure Laterality Date   CARPAL TUNNEL WITH CUBITAL TUNNEL Right    CHOLECYSTECTOMY     HAND SURGERY Right    fracture repair in 1990   SHOULDER SURGERY Right    SPINE SURGERY     TUBAL LIGATION      Current Medications: Current Meds  Medication Sig   amLODipine  (NORVASC ) 10 MG tablet Take 1 tablet (10 mg total) by mouth daily.   atorvastatin  (LIPITOR) 80 MG tablet Take 1 tablet (80 mg total) by mouth daily.   clopidogrel  (PLAVIX ) 75 MG tablet Take 1 tablet (75 mg total) by mouth daily.   fluticasone  (FLONASE ) 50 MCG/ACT nasal spray SPRAY 2 SPRAYS INTO EACH  NOSTRIL EVERY DAY   Glucose Blood (BLOOD GLUCOSE TEST STRIPS) STRP Use as directed to monitor FSBS once daily for DM   hydrALAZINE  (APRESOLINE ) 10 MG tablet Take 1 tablet (10 mg total) by mouth 3 (three) times daily as needed (take when blood pressure greater than 160/90).   Lancets (ONETOUCH DELICA PLUS LANCET33G) MISC Apply topically as directed.   lisinopril  (ZESTRIL ) 20 MG tablet Take 1 tablet (20 mg total) by mouth daily.   metFORMIN  (GLUCOPHAGE -XR) 500 MG 24 hr tablet Take 1 tablet (500 mg total) by mouth 2 (two) times daily with a meal.   metoprolol  succinate (TOPROL -XL) 25 MG 24 hr tablet Take 1 tablet (25 mg total) by mouth in the morning and at bedtime.   metoprolol  tartrate (LOPRESSOR ) 100 MG tablet TAKE 1 TABLET 2 HR PRIOR TO CARDIAC PROCEDURE   Semaglutide ,0.25 or 0.5MG /DOS, (OZEMPIC , 0.25 OR 0.5 MG/DOSE,) 2 MG/3ML SOPN INJECT 0.5 MG INTO THE SKIN ONE TIME PER WEEK     Allergies:   Morphine and codeine, Aspirin , Ciprofloxacin, Codeine, Elemental sulfur, Influenza vaccines, and Penicillin g   Social History   Socioeconomic History   Marital status: Single    Spouse name: Not on file   Number of children: 1   Years of education: Not on file   Highest education level: Associate degree: occupational, Scientist, product/process development, or vocational program  Occupational History   Not on file  Tobacco Use   Smoking status: Never    Passive exposure: Never   Smokeless tobacco: Never  Vaping Use   Vaping status: Never Used  Substance and Sexual Activity   Alcohol use: Not Currently    Alcohol/week: 2.0 standard drinks of alcohol    Types: 2 Cans of beer per week   Drug use: Never   Sexual activity: Yes  Other Topics Concern   Not on file  Social History Narrative   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 Lives alone.     Social Drivers of Health   Financial Resource Strain: Medium Risk (04/14/2023)   Overall Financial Resource Strain (CARDIA)    Difficulty of Paying Living Expenses:  Somewhat hard  Food Insecurity: No Food Insecurity (08/12/2023)   Hunger Vital Sign    Worried About Running Out of Food in the Last Year: Never true    Ran Out of Food in the Last Year: Never true  Transportation Needs: No Transportation Needs (04/14/2023)   PRAPARE - Administrator, Civil Service (Medical): No    Lack of Transportation (Non-Medical): No  Physical Activity: Sufficiently Active (04/14/2023)   Exercise Vital Sign    Days of Exercise per Week: 4 days    Minutes of Exercise per Session: 60 min  Stress: No Stress Concern Present (08/12/2023)   [employer] of Occupational Health - Occupational Stress Questionnaire    Feeling of Stress : Not at all  Social Connections: Socially Isolated (08/12/2023)   Social Connection and Isolation Panel [NHANES]    Frequency of Communication with Friends and Family: More than three times a week    Frequency of Social Gatherings with Friends and Family: More than three times a week    Attends Religious Services: Never    Database administrator or Organizations: No    Attends Engineer, structural: Never    Marital Status: Divorced     Family History: The patient's family history includes Dementia in her mother; Diabetes in her father, paternal aunt, paternal grandmother, and paternal uncle; Fainting in her child; Heart attack in her child; Heart disease in her brother, father, maternal uncle, paternal aunt, and paternal uncle; Stroke in her paternal grandmother.  ROS:   Please see the history of present illness.     All other systems reviewed and are negative.  EKGs/Labs/Other Studies Reviewed:    The following studies were reviewed today:  EKG Interpretation Date/Time:  Monday Sep 08 2023 09:19:37 EDT Ventricular Rate:  72 PR Interval:  194 QRS Duration:  86 QT Interval:  386 QTC Calculation: 422 R Axis:   -22  Text Interpretation: Normal sinus rhythm Cannot rule out Anterior infarct , age undetermined  Confirmed by Constancia Delton ([id number]) on 09/08/2023 9:46:14 AM    Recent Labs: 05/02/2023: ALT 24; BUN 14; Creat 0.68; Hemoglobin 13.6; Platelets 262; Potassium 4.1; Sodium 138  Recent Lipid Panel    Component Value Date/Time   CHOL 111 05/02/2023 0858   TRIG 93 05/02/2023 0858   HDL 50 05/02/2023 0858   CHOLHDL 2.2 05/02/2023 0858   VLDL 29 10/15/2022 0516   LDLCALC 43 05/02/2023 0858     Risk Assessment/Calculations:             Physical Exam:    VS:  BP 130/60 (BP Location: Left Arm, Patient Position: Sitting, Cuff Size: Normal)   Pulse 72   Ht 4\' 11"  (1.499  m)   Wt 171 lb 3.2 oz (77.7 kg)   SpO2 98%   BMI 34.58 kg/m     Wt Readings from Last 3 Encounters:  09/08/23 171 lb 3.2 oz (77.7 kg)  08/12/23 173 lb 11.2 oz (78.8 kg)  06/10/23 178 lb 11.2 oz (81.1 kg)     GEN:  Well nourished, well developed in no acute distress HEENT: Normal NECK: No JVD; No carotid bruits CARDIAC: RRR, no murmurs, rubs, gallops RESPIRATORY:  Clear to auscultation without rales, wheezing or rhonchi  ABDOMEN: Soft, non-tender, non-distended MUSCULOSKELETAL:  No edema; No deformity  SKIN: Warm and dry NEUROLOGIC:  Alert and oriented x 3 PSYCHIATRIC:  Normal affect   ASSESSMENT:    1. Precordial pain   2. Essential hypertension   3. TIA (transient ischemic attack)   4. Stenosis of right carotid artery    PLAN:    In order of problems listed above:  Epigastric pain, several risk factors, family history of early CAD.  Echo 09/2022 EF 60 to 65%.  Obtain coronary CTA.  Continue aspirin , Plavix , Lipitor 80. Hypertension, BP controlled.  Continue lisinopril  20, amlodipine  10, Lopressor  25 twice daily. History of TIA, please cardiac monitor to rule out A-fib or flutter.  Intracranial stenosis/vascular likely reason for TIA/cerebrovascular incident. Carotid stenosis, referred to vascular surgery.  Likely not amenable to surgery.  Continue aspirin , Plavix , Lipitor.  Follow-up after  cardiac testing      Medication Adjustments/Labs and Tests Ordered: Current medicines are reviewed at length with the patient today.  Concerns regarding medicines are outlined above.  Orders Placed This Encounter  Procedures   CT CORONARY MORPH W/CTA COR W/SCORE W/CA W/CM &/OR WO/CM   Basic metabolic panel with GFR   Ambulatory referral to Vascular Surgery   LONG TERM MONITOR (3-14 DAYS)   EKG 12-Lead   Meds ordered this encounter  Medications   metoprolol  tartrate (LOPRESSOR ) 100 MG tablet    Sig: TAKE 1 TABLET 2 HR PRIOR TO CARDIAC PROCEDURE    Dispense:  1 tablet    Refill:  0    Patient Instructions  Medication Instructions:  Your Physician recommend you continue on your current medication as directed.    *If you need a refill on your cardiac medications before your next appointment, please call your pharmacy*  Lab Work: Your provider would like for you to have following labs drawn today BMP.    If you have labs (blood work) drawn today and your tests are completely normal, you will receive your results only by: MyChart Message (if you have MyChart) OR A paper copy in the mail If you have any lab test that is abnormal or we need to change your treatment, we will call you to review the results.  Testing/Procedures:   Your cardiac CT will be scheduled at:  [facility] [address] [place], [place] [address] [phone]  Please arrive 15 mins early for check-in and test prep.  There is spacious parking and easy access to the radiology department from the [facility] entrance. Please enter here and check-in with the desk attendant.    Please follow these instructions carefully (unless otherwise directed):  An IV will be required for this test and Nitroglycerin will be given.  Hold all erectile dysfunction medications at least 3 days (72 hrs) prior to test. (Ie viagra, cialis, sildenafil, tadalafil, etc)   On the Night  Before the Test: Be sure to Drink plenty of water.  Do not consume any caffeinated/decaffeinated beverages or chocolate 12 hours prior to your test. Do not take any antihistamines 12 hours prior to your test.  On the Day of the Test: Drink plenty of water until 1 hour prior to the test. Do not eat any food 1 hour prior to test. You may take your regular medications prior to the test.  Take metoprolol  (Lopressor ) two hours prior to test. If you take Furosemide/Hydrochlorothiazide/Spironolactone/Chlorthalidone, please HOLD on the morning of the test. Patients who wear a continuous glucose monitor MUST remove the device prior to scanning. FEMALES- please wear underwire-free bra if available, avoid dresses & tight clothing  After the Test: Drink plenty of water. After receiving IV contrast, you may experience a mild flushed feeling. This is normal. On occasion, you may experience a mild rash up to 24 hours after the test. This is not dangerous. If this occurs, you can take Benadryl 25 mg, Zyrtec, Claritin, or Allegra and increase your fluid intake. (Patients taking Tikosyn should avoid Benadryl, and may take Zyrtec, Claritin, or Allegra) If you experience trouble breathing, this can be serious. If it is severe call 911 IMMEDIATELY. If it is mild, please call our office.  We will call to schedule your test 2-4 weeks out understanding that some insurance companies will need an authorization prior to the service being performed.   For more information and frequently asked questions, please visit our website : [email or web address]  For non-scheduling related questions, please contact the cardiac imaging nurse navigator should you have any questions/concerns: Cardiac Imaging Nurse Navigators Direct Office Dial: [phone]   For scheduling needs, including cancellations and rescheduling, please call [place], [phone].    Your physician has recommended that you wear a Zio  monitor.   This monitor is a medical device that records the heart's electrical activity. Doctors most often use these monitors to diagnose arrhythmias. Arrhythmias are problems with the speed or rhythm of the heartbeat. The monitor is a small device applied to your chest. You can wear one while you do your normal daily activities. While wearing this monitor if you have any symptoms to push the button and record what you felt. Once you have worn this monitor for the period of time provider prescribed (Usually 14 days), you will return the monitor device in the postage paid box. Once it is returned they will download the data collected and provide us  with a report which the provider will then review and we will call you with those results. Important tips:  Avoid showering during the first 24 hours of wearing the monitor. Avoid excessive sweating to help maximize wear time. Do not submerge the device, no hot tubs, and no swimming pools. Keep any lotions or oils away from the patch. After 24 hours you may shower with the patch on. Take brief showers with your back facing the shower head.  Do not remove patch once it has been placed because that will interrupt data and decrease adhesive wear time. Push the button when you have any symptoms and write down what you were feeling. Once you have completed wearing your monitor, remove and place into box which has postage paid and place in your outgoing mailbox.  If for some reason you have misplaced your box then call our office and we can provide another box and/or mail it off for you.   Follow-Up: At [facility], you and your health needs are our priority.  As part of our continuing mission to provide you  with exceptional heart care, our providers are all part of one team.  This team includes your primary Cardiologist (physician) and Advanced Practice Providers or APPs (Physician Assistants and Nurse Practitioners) who all work together to provide  you with the care you need, when you need it.  Your next appointment:   3 month(s)  Provider:   You may see Dr. Junnie Olives or one of the following Advanced Practice Providers on your designated Care Team:   Laneta Pintos, NP Gildardo Labrador, PA-C Varney Gentleman, PA-C Cadence Ashland, PA-C Ronald Cockayne, NP Morey Ar, NP    We recommend signing up for the patient portal called "MyChart".  Sign up information is provided on this After Visit Summary.  MyChart is used to connect with patients for Virtual Visits (Telemedicine).  Patients are able to view lab/test results, encounter notes, upcoming appointments, etc.  Non-urgent messages can be sent to your provider as well.   To learn more about what you can do with MyChart, go to [email or web address].         Signed, Constancia Delton, MD  09/08/2023 10:43 AM    [facility]

## 2023-09-09 LAB — BASIC METABOLIC PANEL WITH GFR
BUN/Creatinine Ratio: 24 (ref 12–28)
BUN: 16 mg/dL (ref 8–27)
CO2: 19 mmol/L — ABNORMAL LOW (ref 20–29)
Calcium: 9.5 mg/dL (ref 8.7–10.3)
Chloride: 102 mmol/L (ref 96–106)
Creatinine, Ser: 0.68 mg/dL (ref 0.57–1.00)
Glucose: 129 mg/dL — ABNORMAL HIGH (ref 70–99)
Potassium: 4 mmol/L (ref 3.5–5.2)
Sodium: 139 mmol/L (ref 134–144)
eGFR: 95 mL/min/{1.73_m2} (ref >59)

## 2023-09-24 ENCOUNTER — Encounter (INDEPENDENT_AMBULATORY_CARE_PROVIDER_SITE_OTHER): Payer: Self-pay

## 2023-09-30 ENCOUNTER — Other Ambulatory Visit: Payer: Self-pay | Admitting: Nurse Practitioner

## 2023-09-30 DIAGNOSIS — J309 Allergic rhinitis, unspecified: Secondary | ICD-10-CM

## 2023-10-01 DIAGNOSIS — R072 Precordial pain: Secondary | ICD-10-CM | POA: Diagnosis not present

## 2023-10-01 NOTE — Telephone Encounter (Signed)
 Requested medication (s) are due for refill today: Yes  Requested medication (s) are on the active medication list: Yes  Last refill:  06/23/23  Future visit scheduled: Yes  Notes to clinic:  See pharmacy request.    Requested Prescriptions  Pending Prescriptions Disp Refills   fluticasone  (FLONASE ) 50 MCG/ACT nasal spray [Pharmacy Med Name: FLUTICASONE  PROP 50 MCG SPRAY] 48 mL 1    Sig: SPRAY 2 SPRAYS INTO EACH NOSTRIL EVERY DAY     Ear, Nose, and Throat: Nasal Preparations - Corticosteroids Passed - 10/01/2023  3:00 PM      Passed - Valid encounter within last 12 months    Recent Outpatient Visits           1 month ago Essential hypertension   [facility] Quinton Buckler, FNP   3 months ago Mixed hyperlipidemia   [facility] Quinton Buckler, FNP       Future Appointments             In 2 months Agbor-Etang, Polly Brink, MD [facility]

## 2023-10-06 ENCOUNTER — Ambulatory Visit: Payer: Self-pay | Admitting: Cardiology

## 2023-10-06 DIAGNOSIS — R072 Precordial pain: Secondary | ICD-10-CM | POA: Diagnosis not present

## 2023-10-06 DIAGNOSIS — G459 Transient cerebral ischemic attack, unspecified: Secondary | ICD-10-CM

## 2023-10-14 ENCOUNTER — Encounter (HOSPITAL_COMMUNITY): Payer: Self-pay

## 2023-10-16 ENCOUNTER — Ambulatory Visit
Admission: RE | Admit: 2023-10-16 | Discharge: 2023-10-16 | Disposition: A | Discharge status: Home / Self Care | Source: Ambulatory Visit | Attending: Cardiology | Admitting: Cardiology

## 2023-10-16 DIAGNOSIS — R072 Precordial pain: Secondary | ICD-10-CM | POA: Diagnosis not present

## 2023-10-16 MED ORDER — DILTIAZEM HCL 25 MG/5ML IV SOLN
INTRAVENOUS | Status: AC
  Filled 2023-10-16: qty 5

## 2023-10-16 MED ORDER — IOHEXOL 350 MG/ML SOLN
80.0000 mL | Freq: Once | INTRAVENOUS | Status: AC | PRN
  Administered 2023-10-16: 80 mL via INTRAVENOUS

## 2023-10-16 MED ORDER — METOPROLOL TARTRATE 5 MG/5ML IV SOLN
INTRAVENOUS | Status: AC
  Filled 2023-10-16: qty 10

## 2023-10-16 MED ORDER — METOPROLOL TARTRATE 5 MG/5ML IV SOLN
10.0000 mg | Freq: Once | INTRAVENOUS | Status: AC | PRN
  Administered 2023-10-16: 10 mg via INTRAVENOUS
  Filled 2023-10-16: qty 10

## 2023-10-16 MED ORDER — NITROGLYCERIN 0.4 MG SL SUBL
0.8000 mg | SUBLINGUAL_TABLET | Freq: Once | SUBLINGUAL | Status: AC
  Administered 2023-10-16: 0.8 mg via SUBLINGUAL
  Filled 2023-10-16: qty 25

## 2023-10-16 MED ORDER — DILTIAZEM HCL 25 MG/5ML IV SOLN
10.0000 mg | INTRAVENOUS | Status: DC | PRN
  Administered 2023-10-16: 10 mg via INTRAVENOUS
  Filled 2023-10-16 (×2): qty 5

## 2023-10-16 NOTE — Progress Notes (Signed)
 Patient tolerated CT well. Drank water after. Vital signs stable encourage to drink water throughout day.Reasons explained and verbalized understanding. Ambulated steady gait.

## 2023-11-04 ENCOUNTER — Ambulatory Visit (INDEPENDENT_AMBULATORY_CARE_PROVIDER_SITE_OTHER): Admitting: Vascular Surgery

## 2023-11-04 ENCOUNTER — Encounter (INDEPENDENT_AMBULATORY_CARE_PROVIDER_SITE_OTHER): Payer: Self-pay | Admitting: Vascular Surgery

## 2023-11-04 VITALS — BP 128/70 | HR 79 | Resp 18 | Ht <= 58 in

## 2023-11-04 DIAGNOSIS — I6523 Occlusion and stenosis of bilateral carotid arteries: Secondary | ICD-10-CM

## 2023-11-04 DIAGNOSIS — E782 Mixed hyperlipidemia: Secondary | ICD-10-CM | POA: Diagnosis not present

## 2023-11-04 DIAGNOSIS — I6529 Occlusion and stenosis of unspecified carotid artery: Secondary | ICD-10-CM | POA: Insufficient documentation

## 2023-11-04 DIAGNOSIS — I10 Essential (primary) hypertension: Secondary | ICD-10-CM | POA: Diagnosis not present

## 2023-11-04 DIAGNOSIS — E1165 Type 2 diabetes mellitus with hyperglycemia: Secondary | ICD-10-CM | POA: Diagnosis not present

## 2023-11-04 NOTE — Assessment & Plan Note (Signed)
 lipid control important in reducing the progression of atherosclerotic disease. Continue statin therapy

## 2023-11-04 NOTE — Assessment & Plan Note (Signed)
 She underwent a CT angiogram of the neck and head last June which I have independently reviewed.  This was when she was having TIA symptoms.  Her cervical carotid arteries had a small amount of plaque but no significant disease.  She did have some intracranial disease seen on the CT scan.  At this point, I would recommend continued medical management no intervention.  She is on Plavix  and Lipitor which agree with.  I would not plan any intervention for her intracranial disease unless she began developing focal symptoms at which point I could refer her to a neurosurgeon.  I would recommend a carotid duplex to follow her mild carotid disease in the cervical portions in the near future at her convenience.  Will see her back following the study.

## 2023-11-04 NOTE — Assessment & Plan Note (Signed)
 blood pressure control important in reducing the progression of atherosclerotic disease. On appropriate oral medications.

## 2023-11-04 NOTE — Progress Notes (Signed)
 Patient ID: Kathleen Hickman, female   DOB: [birth year], 68 y.o.   MRN: [id number]  Chief Complaint  Patient presents with   New Patient (Initial Visit)    Ref Agbor-Etang consult carotid stenosis    HPI Kathleen Hickman is a 68 y.o. female.  I am asked to see the patient by Dr. Darliss for evaluation of carotid stenosis with previous TIA symptoms.  She has somewhat recently been diagnosed with diabetes and is working on getting her sugars under control.  She also has high blood pressure and hypertension.  She is active and exercises at the gym several times a week.  She has a strong family history of atherosclerotic vascular disease and heart disease.  She underwent a CT angiogram of the neck and head last June which I have independently reviewed.  This was when she was having TIA symptoms.  Her cervical carotid arteries had a small amount of plaque but no significant disease.  She did have some intracranial disease seen on the CT scan.  That has been managed medically and she has had no further events.  She is referred for continued and ongoing evaluation of her cerebrovascular symptoms.     Past Medical History:  Diagnosis Date   Diabetes mellitus without complication (HCC)    Hyperlipidemia    Hypertension     Past Surgical History:  Procedure Laterality Date   CARPAL TUNNEL WITH CUBITAL TUNNEL Right    CHOLECYSTECTOMY     HAND SURGERY Right    fracture repair in 1990   SHOULDER SURGERY Right    SPINE SURGERY     TUBAL LIGATION       Family History  Problem Relation Age of Onset   Dementia Mother    Heart disease Father    Diabetes Father    Heart disease Brother    Heart disease Maternal Uncle    Diabetes Paternal Aunt    Heart disease Paternal Aunt    Diabetes Paternal Uncle    Heart disease Paternal Uncle    Diabetes Paternal Grandmother    Stroke Paternal Grandmother    Heart attack Child    Fainting Child       Social History   Tobacco Use    Smoking status: Never    Passive exposure: Never   Smokeless tobacco: Never  Vaping Use   Vaping status: Never Used  Substance Use Topics   Alcohol use: Not Currently    Alcohol/week: 2.0 standard drinks of alcohol    Types: 2 Cans of beer per week   Drug use: Never     Allergies  Allergen Reactions   Morphine And Codeine Anaphylaxis   Aspirin  Nausea Only    Pt states can take enteric coated low-dose aspirin .   Ciprofloxacin Hives   Codeine Other (See Comments)    Thrush in the mouth   Elemental Sulfur Hives   Influenza Vaccines Rash    Knot at injection site   Penicillin G Rash    Current Outpatient Medications  Medication Sig Dispense Refill   amLODipine  (NORVASC ) 10 MG tablet Take 1 tablet (10 mg total) by mouth daily. 90 tablet 1   atorvastatin  (LIPITOR) 80 MG tablet Take 1 tablet (80 mg total) by mouth daily. 90 tablet 1   clopidogrel  (PLAVIX ) 75 MG tablet Take 1 tablet (75 mg total) by mouth daily. 90 tablet 3   fluticasone  (FLONASE ) 50 MCG/ACT nasal spray SPRAY 2 SPRAYS INTO EACH NOSTRIL EVERY DAY 48  mL 1   Glucose Blood (BLOOD GLUCOSE TEST STRIPS) STRP Use as directed to monitor FSBS once daily for DM 100 strip 3   hydrALAZINE  (APRESOLINE ) 10 MG tablet Take 1 tablet (10 mg total) by mouth 3 (three) times daily as needed (take when blood pressure greater than 160/90). 90 tablet 0   Lancets (ONETOUCH DELICA PLUS LANCET33G) MISC Apply topically as directed.     lisinopril  (ZESTRIL ) 20 MG tablet Take 1 tablet (20 mg total) by mouth daily. 90 tablet 1   metFORMIN  (GLUCOPHAGE -XR) 500 MG 24 hr tablet Take 1 tablet (500 mg total) by mouth 2 (two) times daily with a meal. 180 tablet 1   metoprolol  succinate (TOPROL -XL) 25 MG 24 hr tablet Take 1 tablet (25 mg total) by mouth in the morning and at bedtime. 180 tablet 1   metoprolol  tartrate (LOPRESSOR ) 100 MG tablet TAKE 1 TABLET 2 HR PRIOR TO CARDIAC PROCEDURE 1 tablet 0   Semaglutide ,0.25 or 0.5MG /DOS, (OZEMPIC , 0.25 OR 0.5  MG/DOSE,) 2 MG/3ML SOPN INJECT 0.5 MG INTO THE SKIN ONE TIME PER WEEK 3 mL 2   No current facility-administered medications for this visit.      REVIEW OF SYSTEMS (Negative unless checked)  Constitutional: [] Weight loss  [] Fever  [] Chills Cardiac: [] Chest pain   [] Chest pressure   [] Palpitations   [] Shortness of breath when laying flat   [] Shortness of breath at rest   [] Shortness of breath with exertion. Vascular:  [] Pain in legs with walking   [] Pain in legs at rest   [] Pain in legs when laying flat   [] Claudication   [] Pain in feet when walking  [] Pain in feet at rest  [] Pain in feet when laying flat   [] History of DVT   [] Phlebitis   [] Swelling in legs   [] Varicose veins   [] Non-healing ulcers Pulmonary:   [] Uses home oxygen   [] Productive cough   [] Hemoptysis   [] Wheeze  [] COPD   [] Asthma Neurologic:  [] Dizziness  [] Blackouts   [] Seizures   [] History of stroke   [x] History of TIA  [] Aphasia   [] Temporary blindness   [] Dysphagia   [] Weakness or numbness in arms   [] Weakness or numbness in legs Musculoskeletal:  [] Arthritis   [] Joint swelling   [] Joint pain   [] Low back pain Hematologic:  [] Easy bruising  [] Easy bleeding   [] Hypercoagulable state   [] Anemic  [] Hepatitis Gastrointestinal:  [] Blood in stool   [] Vomiting blood  [] Gastroesophageal reflux/heartburn   [] Abdominal pain Genitourinary:  [] Chronic kidney disease   [] Difficult urination  [] Frequent urination  [] Burning with urination   [] Hematuria Skin:  [] Rashes   [] Ulcers   [] Wounds Psychological:  [] History of anxiety   []  History of major depression.    Physical Exam BP 128/70   Pulse 79   Resp 18   Ht 4' 10 (1.473 m)   BMI 35.78 kg/m  Gen:  WD/WN, NAD.  Appears younger than stated age, Head: [place]/AT, No temporalis wasting.  Ear/Nose/Throat: Hearing grossly intact, nares w/o erythema or drainage, oropharynx w/o Erythema/Exudate Eyes: Conjunctiva clear, sclera non-icteric  Neck: trachea midline.  No JVD.  Pulmonary:   Good air movement, respirations not labored, no use of accessory muscles  Cardiac: RRR, no JVD Vascular:  Vessel Right Left  Radial Palpable Palpable                                   Gastrointestinal:. No masses, surgical  incisions, or scars. Musculoskeletal: M/S 5/5 throughout.  Extremities without ischemic changes.  No deformity or atrophy. No edema. Neurologic: Sensation grossly intact in extremities.  Symmetrical.  Speech is fluent. Motor exam as listed above. Psychiatric: Judgment intact, Mood & affect appropriate for pt's clinical situation. Dermatologic: No rashes or ulcers noted.  No cellulitis or open wounds.    Radiology CT CORONARY MORPH W/CTA COR W/SCORE W/CA W/CM &/OR WO/CM Addendum Date: 10/19/2023 ADDENDUM REPORT: 10/19/2023 14:38 EXAM: OVER-READ INTERPRETATION  CT CHEST The following report is an over-read performed by radiologist Dr. Fonda Mom [facility] on 10/19/2023. This over-read does not include interpretation of cardiac or coronary anatomy or pathology. The coronary CTA interpretation by the cardiologist is attached. COMPARISON:  None. FINDINGS: Cardiovascular:  See findings discussed in the body of the report. Mediastinum/Nodes: No suspicious adenopathy identified. Imaged mediastinal structures are unremarkable. Lungs/Pleura: Calcified nodule left base consistent with old granulomatous disease. Imaged lungs are otherwise clear. No pleural effusion or pneumothorax. Upper Abdomen: No acute abnormality. Musculoskeletal: No chest wall abnormality. No acute osseous findings. There are thoracic degenerative changes. IMPRESSION: No acute extracardiac incidental findings. Electronically Signed   By: Fonda Field M.D.   On: 10/19/2023 14:38   Result Date: 10/19/2023 CLINICAL DATA:  CHEST PAIN EXAM: Cardiac/Coronary  CTA TECHNIQUE: The patient was scanned on a Siemens Somatom scanner. : A retrospective scan was triggered in the ascending thoracic  aorta. Axial non-contrast 3 mm slices were carried out through the heart. The data set was analyzed on a dedicated work station and scored using the Agatson method. Gantry rotation speed was 66 msecs and collimation was .6 mm. 100mg  of metoprolol  and 0.8 mg of sl NTG was given. The 3D data set was reconstructed in 5% intervals of the 60-95 % of the R-R cycle. Diastolic phases were analyzed on a dedicated work station using MPR, MIP and VRT modes. The patient received 80 cc of contrast. FINDINGS: Aorta:  Normal size.  No calcifications.  No dissection. Aortic Valve:  Trileaflet.  No calcifications. Coronary Arteries:  Normal coronary origin.  Right dominance. RCA is a dominant artery. There is no plaque. Left main gives rise to LAD and LCX arteries. LM has no disease. LAD has no plaque. LCX is a non-dominant artery.  There is no plaque. Other findings: Normal pulmonary vein drainage into the left atrium. Normal left atrial appendage without a thrombus. Normal size of the pulmonary artery. IMPRESSION: 1. Coronary calcium  score of 0. 2. Normal coronary origin with right dominance. 3. No evidence of CAD. 4. CAD-RADS 0. Consider non-atherosclerotic causes of chest pain. Electronically Signed: By: Redell Cave M.D. On: 10/16/2023 13:07    Labs Recent Results (from the past 2160 hours)  POCT HgB A1C     Status: Abnormal   Collection Time: 08/12/23  7:42 AM  Result Value Ref Range   Hemoglobin A1C 7.0 (A) 4.0 - 5.6 %   HbA1c POC (<> result, manual entry)     HbA1c, POC (prediabetic range)     HbA1c, POC (controlled diabetic range)    Basic metabolic panel with GFR     Status: Abnormal   Collection Time: 09/08/23 10:10 AM  Result Value Ref Range   Glucose 129 (H) 70 - 99 mg/dL   BUN 16 8 - 27 mg/dL   Creatinine, Ser 9.31 0.57 - 1.00 mg/dL   eGFR 95 >40 fO/fpw/8.26   BUN/Creatinine Ratio 24 12 - 28   Sodium 139 134 - 144 mmol/L  Potassium 4.0 3.5 - 5.2 mmol/L   Chloride 102 96 - 106 mmol/L   CO2  19 (L) 20 - 29 mmol/L   Calcium  9.5 8.7 - 10.3 mg/dL    Assessment/Plan:  Essential hypertension blood pressure control important in reducing the progression of atherosclerotic disease. On appropriate oral medications.   Uncontrolled type 2 diabetes mellitus with hyperglycemia, without long-term current use of insulin  (HCC) blood glucose control important in reducing the progression of atherosclerotic disease. Also, involved in wound healing. On appropriate medications.   Mixed hyperlipidemia lipid control important in reducing the progression of atherosclerotic disease. Continue statin therapy   Carotid stenosis She underwent a CT angiogram of the neck and head last June which I have independently reviewed.  This was when she was having TIA symptoms.  Her cervical carotid arteries had a small amount of plaque but no significant disease.  She did have some intracranial disease seen on the CT scan.  At this point, I would recommend continued medical management no intervention.  She is on Plavix  and Lipitor which agree with.  I would not plan any intervention for her intracranial disease unless she began developing focal symptoms at which point I could refer her to a neurosurgeon.  I would recommend a carotid duplex to follow her mild carotid disease in the cervical portions in the near future at her convenience.  Will see her back following the study.      Selinda Gu 11/04/2023, 10:15 AM   This note was created with Dragon medical transcription system.  Any errors from dictation are unintentional.

## 2023-11-04 NOTE — Assessment & Plan Note (Signed)
 blood glucose control important in reducing the progression of atherosclerotic disease. Also, involved in wound healing. On appropriate medications.

## 2023-11-13 ENCOUNTER — Other Ambulatory Visit: Payer: Self-pay | Admitting: Nurse Practitioner

## 2023-11-13 DIAGNOSIS — E1165 Type 2 diabetes mellitus with hyperglycemia: Secondary | ICD-10-CM

## 2023-11-14 NOTE — Telephone Encounter (Signed)
 Requested Prescriptions  Pending Prescriptions Disp Refills   Semaglutide ,0.25 or 0.5MG /DOS, (OZEMPIC , 0.25 OR 0.5 MG/DOSE,) 2 MG/3ML SOPN [Pharmacy Med Name: OZEMPIC  0.25-0.5 MG/DOSE PEN] 2 mL 2    Sig: INJECT 0.5 MG INTO THE SKIN ONE TIME PER WEEK     Endocrinology:  Diabetes - GLP-1 Receptor Agonists - semaglutide  Failed - 11/14/2023 12:34 PM      Failed - HBA1C in normal range and within 180 days    Hemoglobin A1C  Date Value Ref Range Status  08/12/2023 7.0 (A) 4.0 - 5.6 % Final   Hgb A1c MFr Bld  Date Value Ref Range Status  05/02/2023 8.6 (H) <5.7 % of total Hgb Final    Comment:    For someone without known diabetes, a hemoglobin A1c value of 6.5% or greater indicates that they may have  diabetes and this should be confirmed with a follow-up  test. . For someone with known diabetes, a value <7% indicates  that their diabetes is well controlled and a value  greater than or equal to 7% indicates suboptimal  control. A1c targets should be individualized based on  duration of diabetes, age, comorbid conditions, and  other considerations. . Currently, no consensus exists regarding use of hemoglobin A1c for diagnosis of diabetes for children. .          Passed - Cr in normal range and within 360 days    Creat  Date Value Ref Range Status  05/02/2023 0.68 0.50 - 1.05 mg/dL Final   Creatinine, Ser  Date Value Ref Range Status  09/08/2023 0.68 0.57 - 1.00 mg/dL Final   Creatinine, Urine  Date Value Ref Range Status  11/07/2022 71 20 - 275 mg/dL Final         Passed - Valid encounter within last 6 months    Recent Outpatient Visits           3 months ago Essential hypertension   [facility] Gareth Mliss FALCON, FNP   5 months ago Mixed hyperlipidemia   [facility] Gareth Mliss FALCON, FNP       Future Appointments             In 3 weeks Agbor-Etang, Redell, MD [facility]

## 2023-11-18 ENCOUNTER — Ambulatory Visit (INDEPENDENT_AMBULATORY_CARE_PROVIDER_SITE_OTHER): Admitting: Vascular Surgery

## 2023-11-18 ENCOUNTER — Other Ambulatory Visit (INDEPENDENT_AMBULATORY_CARE_PROVIDER_SITE_OTHER)

## 2023-11-18 VITALS — BP 119/70 | HR 67 | Ht 59.0 in | Wt 169.3 lb

## 2023-11-18 DIAGNOSIS — E1165 Type 2 diabetes mellitus with hyperglycemia: Secondary | ICD-10-CM | POA: Diagnosis not present

## 2023-11-18 DIAGNOSIS — I6523 Occlusion and stenosis of bilateral carotid arteries: Secondary | ICD-10-CM | POA: Diagnosis not present

## 2023-11-18 DIAGNOSIS — I10 Essential (primary) hypertension: Secondary | ICD-10-CM | POA: Diagnosis not present

## 2023-11-18 DIAGNOSIS — E782 Mixed hyperlipidemia: Secondary | ICD-10-CM | POA: Diagnosis not present

## 2023-11-18 NOTE — Progress Notes (Signed)
 MRN : [id number]  Kathleen Hickman is a 68 y.o. ([birth year]) female who presents with chief complaint of  Chief Complaint  Patient presents with   fu pt conv + Carotid see JD  .  History of Present Illness: Patient returns today in follow up of her carotid disease.  No major changes since her last visit earlier this month.  No new focal neurologic symptoms.  Carotid duplex today shows less than 50% right common carotid artery stenosis with no significant ICA stenosis bilaterally.  Current Outpatient Medications  Medication Sig Dispense Refill   amLODipine  (NORVASC ) 10 MG tablet Take 1 tablet (10 mg total) by mouth daily. 90 tablet 1   clopidogrel  (PLAVIX ) 75 MG tablet Take 1 tablet (75 mg total) by mouth daily. 90 tablet 3   fluticasone  (FLONASE ) 50 MCG/ACT nasal spray SPRAY 2 SPRAYS INTO EACH NOSTRIL EVERY DAY 48 mL 1   Glucose Blood (BLOOD GLUCOSE TEST STRIPS) STRP Use as directed to monitor FSBS once daily for DM 100 strip 3   Lancets (ONETOUCH DELICA PLUS LANCET33G) MISC Apply topically as directed.     lisinopril  (ZESTRIL ) 20 MG tablet Take 1 tablet (20 mg total) by mouth daily. 90 tablet 1   metFORMIN  (GLUCOPHAGE -XR) 500 MG 24 hr tablet Take 1 tablet (500 mg total) by mouth 2 (two) times daily with a meal. 180 tablet 1   metoprolol  succinate (TOPROL -XL) 25 MG 24 hr tablet Take 1 tablet (25 mg total) by mouth in the morning and at bedtime. 180 tablet 1   metoprolol  tartrate (LOPRESSOR ) 100 MG tablet TAKE 1 TABLET 2 HR PRIOR TO CARDIAC PROCEDURE 1 tablet 0   Semaglutide ,0.25 or 0.5MG /DOS, (OZEMPIC , 0.25 OR 0.5 MG/DOSE,) 2 MG/3ML SOPN INJECT 0.5 MG INTO THE SKIN ONE TIME PER WEEK 2 mL 2   atorvastatin  (LIPITOR) 80 MG tablet Take 1 tablet (80 mg total) by mouth daily. 90 tablet 1   No current facility-administered medications for this visit.    Past Medical History:  Diagnosis Date   Diabetes mellitus without complication (HCC)    Hyperlipidemia    Hypertension     Past  Surgical History:  Procedure Laterality Date   CARPAL TUNNEL WITH CUBITAL TUNNEL Right    CHOLECYSTECTOMY     HAND SURGERY Right    fracture repair in 1990   SHOULDER SURGERY Right    SPINE SURGERY     TUBAL LIGATION       Social History   Tobacco Use   Smoking status: Never    Passive exposure: Never   Smokeless tobacco: Never  Vaping Use   Vaping status: Never Used  Substance Use Topics   Alcohol use: Not Currently    Alcohol/week: 2.0 standard drinks of alcohol    Types: 2 Cans of beer per week   Drug use: Never       Family History  Problem Relation Age of Onset   Dementia Mother    Heart disease Father    Diabetes Father    Heart disease Brother    Heart disease Maternal Uncle    Diabetes Paternal Aunt    Heart disease Paternal Aunt    Diabetes Paternal Uncle    Heart disease Paternal Uncle    Diabetes Paternal Grandmother    Stroke Paternal Grandmother    Heart attack Child    Fainting Child      Allergies  Allergen Reactions   Morphine And Codeine Anaphylaxis   Aspirin  Nausea Only  Pt states can take enteric coated low-dose aspirin .   Ciprofloxacin Hives   Codeine Other (See Comments)    Thrush in the mouth   Elemental Sulfur Hives   Influenza Vaccines Rash    Knot at injection site   Penicillin G Rash     REVIEW OF SYSTEMS (Negative unless checked)  Constitutional: [] Weight loss  [] Fever  [] Chills Cardiac: [] Chest pain   [] Chest pressure   [] Palpitations   [] Shortness of breath when laying flat   [] Shortness of breath at rest   [] Shortness of breath with exertion. Vascular:  [] Pain in legs with walking   [] Pain in legs at rest   [] Pain in legs when laying flat   [] Claudication   [] Pain in feet when walking  [] Pain in feet at rest  [] Pain in feet when laying flat   [] History of DVT   [] Phlebitis   [] Swelling in legs   [] Varicose veins   [] Non-healing ulcers Pulmonary:   [] Uses home oxygen   [] Productive cough   [] Hemoptysis   [] Wheeze   [] COPD   [] Asthma Neurologic:  [] Dizziness  [] Blackouts   [] Seizures   [] History of stroke   [x] History of TIA  [] Aphasia   [] Temporary blindness   [] Dysphagia   [] Weakness or numbness in arms   [] Weakness or numbness in legs Musculoskeletal:  [] Arthritis   [] Joint swelling   [] Joint pain   [] Low back pain Hematologic:  [] Easy bruising  [] Easy bleeding   [] Hypercoagulable state   [] Anemic   Gastrointestinal:  [] Blood in stool   [] Vomiting blood  [] Gastroesophageal reflux/heartburn   [] Abdominal pain Genitourinary:  [] Chronic kidney disease   [] Difficult urination  [] Frequent urination  [] Burning with urination   [] Hematuria Skin:  [] Rashes   [] Ulcers   [] Wounds Psychological:  [] History of anxiety   []  History of major depression.  Physical Examination  BP 119/70   Pulse 67   Ht 4' 11 (1.499 m)   Wt 169 lb 5 oz (76.8 kg)   BMI 34.20 kg/m  Gen:  WD/WN, NAD. Appears younger than stated age. Head: [place]/AT, No temporalis wasting. Ear/Nose/Throat: Hearing grossly intact, nares w/o erythema or drainage Eyes: Conjunctiva clear. Sclera non-icteric Neck: Supple.  Trachea midline Pulmonary:  Good air movement, no use of accessory muscles.  Cardiac: RRR, no JVD Vascular:  Vessel Right Left  Radial Palpable Palpable               Musculoskeletal: M/S 5/5 throughout.  No deformity or atrophy. No edema. Neurologic: Sensation grossly intact in extremities.  Symmetrical.  Speech is fluent.  Psychiatric: Judgment intact, Mood & affect appropriate for pt's clinical situation. Dermatologic: No rashes or ulcers noted.  No cellulitis or open wounds.      Labs Recent Results (from the past 2160 hours)  Basic metabolic panel with GFR     Status: Abnormal   Collection Time: 09/08/23 10:10 AM  Result Value Ref Range   Glucose 129 (H) 70 - 99 mg/dL   BUN 16 8 - 27 mg/dL   Creatinine, Ser 9.31 0.57 - 1.00 mg/dL   eGFR 95 >40 fO/fpw/8.26   BUN/Creatinine Ratio 24 12 - 28   Sodium 139 134 - 144  mmol/L   Potassium 4.0 3.5 - 5.2 mmol/L   Chloride 102 96 - 106 mmol/L   CO2 19 (L) 20 - 29 mmol/L   Calcium  9.5 8.7 - 10.3 mg/dL    Radiology No results found.  Assessment/Plan  Carotid stenosis Carotid duplex today shows less  than 50% right common carotid artery stenosis with no significant ICA stenosis bilaterally.  Continue current medical regimen including Plavix  and statin agent.  No role for intervention for this fairly mild disease.  Follow-up in 1 year with carotid duplex.  Essential hypertension blood pressure control important in reducing the progression of atherosclerotic disease. On appropriate oral medications.     Uncontrolled type 2 diabetes mellitus with hyperglycemia, without long-term current use of insulin  (HCC) blood glucose control important in reducing the progression of atherosclerotic disease. Also, involved in wound healing. On appropriate medications.     Mixed hyperlipidemia lipid control important in reducing the progression of atherosclerotic disease. Continue statin therapy  Selinda Gu, MD  11/18/2023 3:03 PM    This note was created with Dragon medical transcription system.  Any errors from dictation are purely unintentional

## 2023-11-18 NOTE — Assessment & Plan Note (Signed)
 Carotid duplex today shows less than 50% right common carotid artery stenosis with no significant ICA stenosis bilaterally.  Continue current medical regimen including Plavix  and statin agent.  No role for intervention for this fairly mild disease.  Follow-up in 1 year with carotid duplex.

## 2023-11-27 ENCOUNTER — Other Ambulatory Visit: Payer: Self-pay | Admitting: Nurse Practitioner

## 2023-11-27 DIAGNOSIS — I10 Essential (primary) hypertension: Secondary | ICD-10-CM

## 2023-11-27 DIAGNOSIS — E1165 Type 2 diabetes mellitus with hyperglycemia: Secondary | ICD-10-CM

## 2023-11-28 NOTE — Telephone Encounter (Signed)
 Requested medication (s) are due for refill today: Yes  Requested medication (s) are on the active medication list: Yes  Last refill:  08/12/23  Future visit scheduled: Yes  Notes to clinic:  Unable to refill per protocol, last refill by another provider.      Requested Prescriptions  Pending Prescriptions Disp Refills   Lancets (ONETOUCH DELICA PLUS LANCET33G) MISC [Pharmacy Med Name: ONE TOUCH DELICA PLUS 33G LANC] 100 each 3    Sig: AS DIRECTED 90 DAYS     Endocrinology: Diabetes - Testing Supplies Passed - 11/28/2023 11:28 PM      Passed - Valid encounter within last 12 months    Recent Outpatient Visits           3 months ago Essential hypertension   [facility] Gareth Clarity F, FNP   5 months ago Mixed hyperlipidemia   [facility] Gareth Clarity FALCON, FNP       Future Appointments             In 1 week Darliss Rogue, MD [facility]            Signed Prescriptions Disp Refills   metoprolol  succinate (TOPROL -XL) 25 MG 24 hr tablet 180 tablet 0    Sig: TAKE 1 TABLET (25 MG) BY MOUTH IN THE MORNING AND AT BEDTIME     Cardiovascular:  Beta Blockers Passed - 11/28/2023 11:28 PM      Passed - Last BP in normal range    BP Readings from Last 1 Encounters:  11/18/23 119/70         Passed - Last Heart Rate in normal range    Pulse Readings from Last 1 Encounters:  11/18/23 67         Passed - Valid encounter within last 6 months    Recent Outpatient Visits           3 months ago Essential hypertension   [facility] Gareth Clarity FALCON, FNP   5 months ago Mixed hyperlipidemia   [facility] Gareth Clarity FALCON, FNP       Future Appointments             In 1 week Darliss Rogue, MD [facility]             glucose blood [facility] VERIO) test strip 100 strip 2    Sig: FOR USE WHEN CHECKING BLOOD SUGARS  AS DIRECTED     Endocrinology: Diabetes - Testing Supplies Passed - 11/28/2023 11:28 PM      Passed - Valid encounter within last 12 months    Recent Outpatient Visits           3 months ago Essential hypertension   [facility] Gareth Clarity FALCON, FNP   5 months ago Mixed hyperlipidemia   [facility] Gareth Clarity FALCON, FNP       Future Appointments             In 1 week Agbor-Etang, Rogue, MD [facility]

## 2023-12-02 ENCOUNTER — Ambulatory Visit: Admitting: Nurse Practitioner

## 2023-12-02 ENCOUNTER — Other Ambulatory Visit: Payer: Self-pay

## 2023-12-02 ENCOUNTER — Encounter: Payer: Self-pay | Admitting: Nurse Practitioner

## 2023-12-02 VITALS — BP 126/78 | HR 85 | Temp 98.3°F | Resp 16 | Ht <= 58 in | Wt 169.7 lb

## 2023-12-02 DIAGNOSIS — E782 Mixed hyperlipidemia: Secondary | ICD-10-CM | POA: Diagnosis not present

## 2023-12-02 DIAGNOSIS — E1165 Type 2 diabetes mellitus with hyperglycemia: Secondary | ICD-10-CM | POA: Diagnosis not present

## 2023-12-02 DIAGNOSIS — E66812 Obesity, class 2: Secondary | ICD-10-CM

## 2023-12-02 DIAGNOSIS — I10 Essential (primary) hypertension: Secondary | ICD-10-CM

## 2023-12-02 DIAGNOSIS — Z8673 Personal history of transient ischemic attack (TIA), and cerebral infarction without residual deficits: Secondary | ICD-10-CM

## 2023-12-02 DIAGNOSIS — Z6835 Body mass index (BMI) 35.0-35.9, adult: Secondary | ICD-10-CM | POA: Diagnosis not present

## 2023-12-02 MED ORDER — METFORMIN HCL ER 500 MG PO TB24: 500.0000 mg | ORAL_TABLET | Freq: Two times a day (BID) | ORAL | 1 refills | Status: DC

## 2023-12-02 MED ORDER — CLOPIDOGREL BISULFATE 75 MG PO TABS
75.0000 mg | ORAL_TABLET | Freq: Every day | ORAL | 3 refills | Status: AC
Start: 2023-12-02 — End: ?

## 2023-12-02 MED ORDER — AMLODIPINE BESYLATE 10 MG PO TABS: 10.0000 mg | ORAL_TABLET | Freq: Every day | ORAL | 1 refills | Status: AC

## 2023-12-02 MED ORDER — LISINOPRIL 20 MG PO TABS
20.0000 mg | ORAL_TABLET | Freq: Every day | ORAL | 1 refills | Status: AC
Start: 2023-12-02 — End: ?

## 2023-12-02 MED ORDER — ATORVASTATIN CALCIUM 80 MG PO TABS: 80.0000 mg | ORAL_TABLET | Freq: Every day | ORAL | 1 refills | Status: AC

## 2023-12-02 NOTE — Progress Notes (Signed)
 BP 126/78 (Cuff Size: Large)   Pulse 85   Temp 98.3 F (36.8 C) (Oral)   Resp 16   Ht 4' 10 (1.473 m)   Wt 169 lb 11.2 oz (77 kg)   SpO2 98%   BMI 35.47 kg/m    Subjective:    Patient ID: Kathleen Hickman, female    DOB: 03-Apr-1956, 68 y.o.   MRN: [id number]  HPI: Kathleen Hickman is a 68 y.o. female  Chief Complaint  Patient presents with   Medical Management of Chronic Issues    Discussed the use of AI scribe software for clinical note transcription with the patient, who gave verbal consent to proceed.  History of Present Illness Kathleen Hickman is a 68 year old female with hypertension, diabetes, TIAs, hyperlipidemia, and obesity who presents for routine follow-up.  Hypertension and cardiovascular evaluation - Blood pressure remains stable, typically around 118-119 mmHg. - Recent cardiology consultation completed. - Referred to vascular surgery; carotid ultrasound revealed less than 50% stenosis. - Follow-up carotid ultrasound scheduled in one year. - Scheduled to see cardiologist again next week. - Previously had a heart monitor placed. - Current antihypertensive regimen includes amlodipine  10 mg daily, lisinopril  20 mg daily, and metoprolol  25 mg twice daily   Cerebrovascular disease surveillance - History of transient ischemic attacks (TIAs). - On Plavix  75 mg daily for secondary prevention.  Diabetes mellitus management - Glycemic control is stable; last hemoglobin A1c was 7.0%. - Blood glucose levels are well-controlled. - Current medications include metformin  500 mg twice daily and Ozempic  2 mg weekly. - Concern regarding Ozempic  affecting appetite; requires effort to maintain adequate oral intake. - Monitors diet closely, including reading labels to manage sugar intake.  Obesity and weight management - Weight decreased from 173 pounds to 169 pounds over the past four months. - Current BMI is 35.47. - Waist circumference is 40 inches. - Notable changes  in clothing fit, suggesting changes in body composition.  Hyperlipidemia management - On atorvastatin  80 mg daily for lipid control.  Medication adherence and access - Uncertain about current medication refills. - Has two prescriptions to pick up today.    Waist Measurement : 40 inches  Body mass index is 35.47 kg/m.  Filed Weights   12/02/23 0731  Weight: 169 lb 11.2 oz (77 kg)        08/12/2023    7:37 AM 05/20/2023    8:07 AM 04/18/2023    8:11 AM  Depression screen PHQ 2/9  Decreased Interest 0 1 0  Down, Depressed, Hopeless 0 1 0  PHQ - 2 Score 0 2 0  Altered sleeping 0 1 0  Tired, decreased energy 0 1 0  Change in appetite 0 0 0  Feeling bad or failure about yourself  0 0 0  Trouble concentrating 0 0 0  Moving slowly or fidgety/restless 0 0 0  Suicidal thoughts 0 0 0  PHQ-9 Score 0 4 0  Difficult doing work/chores Not difficult at all Somewhat difficult Not difficult at all    Relevant past medical, surgical, family and social history reviewed and updated as indicated. Interim medical history since our last visit reviewed. Allergies and medications reviewed and updated.  Review of Systems  Constitutional: Negative for fever or weight change.  Respiratory: Negative for cough and shortness of breath.   Cardiovascular: Negative for chest pain or palpitations.  Gastrointestinal: Negative for abdominal pain, no bowel changes.  Musculoskeletal: Negative for gait problem or joint swelling.  Skin: Negative for rash.  Neurological: Negative for dizziness or headache.  No other specific complaints in a complete review of systems (except as listed in HPI above).      Objective:     BP 126/78 (Cuff Size: Large)   Pulse 85   Temp 98.3 F (36.8 C) (Oral)   Resp 16   Ht 4' 10 (1.473 m)   Wt 169 lb 11.2 oz (77 kg)   SpO2 98%   BMI 35.47 kg/m    Wt Readings from Last 3 Encounters:  12/02/23 169 lb 11.2 oz (77 kg)  11/18/23 169 lb 5 oz (76.8 kg)  09/08/23 171  lb 3.2 oz (77.7 kg)    Physical Exam Physical Exam VITALS: BP- 126/78 MEASUREMENTS: Weight- 169, BMI- 35.47. GENERAL: Alert, cooperative, well developed, no acute distress HEENT: Normocephalic, normal oropharynx, moist mucous membranes CHEST: Clear to auscultation bilaterally, no wheezes, rhonchi, or crackles CARDIOVASCULAR: Normal heart rate and rhythm, S1 and S2 normal without murmurs ABDOMEN: Soft, non-tender, non-distended, without organomegaly, normal bowel sounds EXTREMITIES: No cyanosis or edema, sensation intact in feet NEUROLOGICAL: Cranial nerves grossly intact, moves all extremities without gross motor or sensory deficit   Diabetic Foot Exam - Simple   Simple Foot Form Diabetic Foot exam was performed with the following findings: Yes 12/02/2023  7:39 AM  Visual Inspection No deformities, no ulcerations, no other skin breakdown bilaterally: Yes Sensation Testing Intact to touch and monofilament testing bilaterally: Yes Pulse Check Posterior Tibialis and Dorsalis pulse intact bilaterally: Yes Comments     Results for orders placed or performed in visit on 09/08/23  Basic metabolic panel with GFR   Collection Time: 09/08/23 10:10 AM  Result Value Ref Range   Glucose 129 (H) 70 - 99 mg/dL   BUN 16 8 - 27 mg/dL   Creatinine, Ser 9.31 0.57 - 1.00 mg/dL   eGFR 95 >40 fO/fpw/8.26   BUN/Creatinine Ratio 24 12 - 28   Sodium 139 134 - 144 mmol/L   Potassium 4.0 3.5 - 5.2 mmol/L   Chloride 102 96 - 106 mmol/L   CO2 19 (L) 20 - 29 mmol/L   Calcium  9.5 8.7 - 10.3 mg/dL          Assessment & Plan:   Problem List Items Addressed This Visit       Cardiovascular and Mediastinum   Essential hypertension - Primary   Relevant Medications   amLODipine  (NORVASC ) 10 MG tablet   atorvastatin  (LIPITOR) 80 MG tablet   lisinopril  (ZESTRIL ) 20 MG tablet   Other Relevant Orders   CBC with Differential/Platelet   Comprehensive metabolic panel with GFR     Endocrine    Uncontrolled type 2 diabetes mellitus with hyperglycemia, without long-term current use of insulin  (HCC)   Relevant Medications   atorvastatin  (LIPITOR) 80 MG tablet   lisinopril  (ZESTRIL ) 20 MG tablet   metFORMIN  (GLUCOPHAGE -XR) 500 MG 24 hr tablet   Other Relevant Orders   Comprehensive metabolic panel with GFR   Lipid panel   Microalbumin / creatinine urine ratio   Hemoglobin A1c   HM Diabetes Foot Exam (Completed)     Other   Obesity (BMI 30-39.9)   Relevant Medications   metFORMIN  (GLUCOPHAGE -XR) 500 MG 24 hr tablet   Mixed hyperlipidemia   Relevant Medications   amLODipine  (NORVASC ) 10 MG tablet   atorvastatin  (LIPITOR) 80 MG tablet   lisinopril  (ZESTRIL ) 20 MG tablet   Other Relevant Orders   Comprehensive metabolic panel with GFR  Lipid panel   Hx of transient ischemic attack (TIA)   Relevant Orders   Comprehensive metabolic panel with GFR   Lipid panel   Other Visit Diagnoses       History of TIA (transient ischemic attack)       Relevant Medications   clopidogrel  (PLAVIX ) 75 MG tablet        Assessment and Plan Assessment & Plan Type 2 diabetes mellitus Type 2 diabetes mellitus is well-controlled with a recent A1c of 7.0%. Blood sugars are stable. She is on Ozempic , which aids in appetite suppression, weight management, and provides cardiovascular and renal protection. After discussing the potential reduction of Ozempic  due to appetite suppression, she opted to maintain the current dose for its additional benefits. - Continue Ozempic  2 mg weekly - Order microalbumin urine test  Hypertension Hypertension is well-controlled with blood pressure readings at 126/78 mmHg. She is on a stable regimen of antihypertensive medications. - Continue current antihypertensive medications: amlodipine , lisinopril , metoprolol   Hyperlipidemia Hyperlipidemia is managed with atorvastatin , and the last LDL was within the normal range. - Continue atorvastatin  80 mg  daily  Obesity Obesity is addressed with weight management strategies, including Ozempic , contributing to a weight loss of 4 pounds since April. Current weight is 169 pounds with a BMI of 35.47. Waist circumference is 40 inches, a better health indicator than BMI. - Continue Ozempic  2 mg weekly        Follow up plan: Return in about 6 months (around 06/03/2024) for follow up.

## 2023-12-03 ENCOUNTER — Ambulatory Visit: Payer: Self-pay | Admitting: Nurse Practitioner

## 2023-12-03 LAB — LIPID PANEL
Cholesterol: 96 mg/dL
HDL: 40 mg/dL — ABNORMAL LOW
LDL Cholesterol (Calc): 40 mg/dL
Non-HDL Cholesterol (Calc): 56 mg/dL
Total CHOL/HDL Ratio: 2.4 (calc)
Triglycerides: 81 mg/dL

## 2023-12-03 LAB — HEMOGLOBIN A1C
Hgb A1c MFr Bld: 6.5 % — ABNORMAL HIGH (ref <5.7)
Mean Plasma Glucose: 140 mg/dL
eAG (mmol/L): 7.7 mmol/L

## 2023-12-03 LAB — CBC WITH DIFFERENTIAL/PLATELET
Absolute Lymphocytes: 2270 {cells}/uL (ref 850–3900)
Absolute Monocytes: 580 {cells}/uL (ref 200–950)
Basophils Absolute: 28 {cells}/uL (ref 0–200)
Basophils Relative: 0.4 %
Eosinophils Absolute: 179 {cells}/uL (ref 15–500)
Eosinophils Relative: 2.6 %
HCT: 39.8 % (ref 35.0–45.0)
Hemoglobin: 13 g/dL (ref 11.7–15.5)
MCH: 31.2 pg (ref 27.0–33.0)
MCHC: 32.7 g/dL (ref 32.0–36.0)
MCV: 95.4 fL (ref 80.0–100.0)
MPV: 10.4 fL (ref 7.5–12.5)
Monocytes Relative: 8.4 %
Neutro Abs: 3843 {cells}/uL (ref 1500–7800)
Neutrophils Relative %: 55.7 %
Platelets: 262 Thousand/uL (ref 140–400)
RBC: 4.17 Million/uL (ref 3.80–5.10)
RDW: 11.7 % (ref 11.0–15.0)
Total Lymphocyte: 32.9 %
WBC: 6.9 Thousand/uL (ref 3.8–10.8)

## 2023-12-03 LAB — EXTRA

## 2023-12-03 LAB — MICROALBUMIN / CREATININE URINE RATIO
Creatinine, Urine: 68 mg/dL (ref 20–275)
Microalb Creat Ratio: 10 mg/g{creat}
Microalb, Ur: 0.7 mg/dL

## 2023-12-03 LAB — COMPREHENSIVE METABOLIC PANEL WITH GFR
AG Ratio: 2.2 (calc) (ref 1.0–2.5)
ALT: 20 U/L (ref 6–29)
AST: 22 U/L (ref 10–35)
Albumin: 4.7 g/dL (ref 3.6–5.1)
Alkaline phosphatase (APISO): 46 U/L (ref 37–153)
BUN: 22 mg/dL (ref 7–25)
CO2: 25 mmol/L (ref 20–32)
Calcium: 9.4 mg/dL (ref 8.6–10.4)
Chloride: 107 mmol/L (ref 98–110)
Creat: 0.68 mg/dL (ref 0.50–1.05)
Globulin: 2.1 g/dL (ref 1.9–3.7)
Glucose, Bld: 118 mg/dL — ABNORMAL HIGH (ref 65–99)
Potassium: 4.2 mmol/L (ref 3.5–5.3)
Sodium: 140 mmol/L (ref 135–146)
Total Bilirubin: 0.5 mg/dL (ref 0.2–1.2)
Total Protein: 6.8 g/dL (ref 6.1–8.1)
eGFR: 94 mL/min/1.73m2

## 2023-12-09 ENCOUNTER — Ambulatory Visit: Attending: Cardiology | Admitting: Cardiology

## 2023-12-09 ENCOUNTER — Encounter: Payer: Self-pay | Admitting: Cardiology

## 2023-12-09 VITALS — BP 110/62 | HR 75 | Ht <= 58 in | Wt 169.4 lb

## 2023-12-09 DIAGNOSIS — G459 Transient cerebral ischemic attack, unspecified: Secondary | ICD-10-CM | POA: Diagnosis not present

## 2023-12-09 DIAGNOSIS — R072 Precordial pain: Secondary | ICD-10-CM

## 2023-12-09 DIAGNOSIS — I10 Essential (primary) hypertension: Secondary | ICD-10-CM | POA: Diagnosis not present

## 2023-12-09 NOTE — Progress Notes (Signed)
 Cardiology Office Note:    Date:  12/09/2023   ID:  Kathleen Hickman, DOB 09/25/1955, MRN [id number]  PCP:  Gareth Mliss FALCON, FNP   [facility] Providers Cardiologist:  None     Referring MD: Gareth Mliss FALCON, FNP   Chief Complaint  Patient presents with   Follow-up    3 month follow up  CT/ZIO , pt has been doing well with no complaints of chest pain, chest pressure or SOB, medciation reviewed verbally with patient    History of Present Illness:    Kathleen Hickman is a 68 y.o. female with a hx of hypertension, hyperlipidemia, diabetes, TIA who presents for follow-up.  Previously seen due to strong family history of CAD, TIA.  Hide occasional epigastric pain likely from reflux.  Due to risk factors, coronary CTA was obtained.  Overall she is doing okay, compliant with medications as prescribed.  Has been exercising well, blood pressure well-controlled on current medications.  Prior notes/testing  Echo 6/24 EF 60 to 65% has a strong family history of CAD, father had coronary artery bypass in his 58s-50s, daughter had stent placement in her 71s, brother had an MI in his 33s.  Past Medical History:  Diagnosis Date   Diabetes mellitus without complication (HCC)    Hyperlipidemia    Hypertension     Past Surgical History:  Procedure Laterality Date   CARPAL TUNNEL WITH CUBITAL TUNNEL Right    CHOLECYSTECTOMY     HAND SURGERY Right    fracture repair in 1990   SHOULDER SURGERY Right    SPINE SURGERY     TUBAL LIGATION      Current Medications: Current Meds  Medication Sig   amLODipine  (NORVASC ) 10 MG tablet Take 1 tablet (10 mg total) by mouth daily.   atorvastatin  (LIPITOR) 80 MG tablet Take 1 tablet (80 mg total) by mouth daily.   clopidogrel  (PLAVIX ) 75 MG tablet Take 1 tablet (75 mg total) by mouth daily.   fluticasone  (FLONASE ) 50 MCG/ACT nasal spray SPRAY 2 SPRAYS INTO EACH NOSTRIL EVERY DAY   glucose blood (ONETOUCH VERIO) test strip FOR USE WHEN  CHECKING BLOOD SUGARS AS DIRECTED   Lancets (ONETOUCH DELICA PLUS LANCET33G) MISC AS DIRECTED 90 DAYS   lisinopril  (ZESTRIL ) 20 MG tablet Take 1 tablet (20 mg total) by mouth daily.   metFORMIN  (GLUCOPHAGE -XR) 500 MG 24 hr tablet Take 1 tablet (500 mg total) by mouth 2 (two) times daily with a meal.   metoprolol  succinate (TOPROL -XL) 25 MG 24 hr tablet TAKE 1 TABLET (25 MG) BY MOUTH IN THE MORNING AND AT BEDTIME   Semaglutide ,0.25 or 0.5MG /DOS, (OZEMPIC , 0.25 OR 0.5 MG/DOSE,) 2 MG/3ML SOPN INJECT 0.5 MG INTO THE SKIN ONE TIME PER WEEK     Allergies:   Morphine and codeine, Aspirin , Ciprofloxacin, Codeine, Elemental sulfur, Influenza vaccines, and Penicillin g   Social History   Socioeconomic History   Marital status: Single    Spouse name: Not on file   Number of children: 1   Years of education: Not on file   Highest education level: Some college, no degree  Occupational History   Not on file  Tobacco Use   Smoking status: Never    Passive exposure: Never   Smokeless tobacco: Never  Vaping Use   Vaping status: Never Used  Substance and Sexual Activity   Alcohol use: Not Currently    Alcohol/week: 2.0 standard drinks of alcohol    Types: 2 Cans of beer  per week   Drug use: Never   Sexual activity: Yes  Other Topics Concern   Not on file  Social History Narrative   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 Lives alone.     Social Drivers of Health   Financial Resource Strain: Medium Risk (11/26/2023)   Overall Financial Resource Strain (CARDIA)    Difficulty of Paying Living Expenses: Somewhat hard  Food Insecurity: Food Insecurity Present (11/26/2023)   Hunger Vital Sign    Worried About Running Out of Food in the Last Year: Sometimes true    Ran Out of Food in the Last Year: Sometimes true  Transportation Needs: No Transportation Needs (11/26/2023)   PRAPARE - Administrator, Civil Service (Medical): No    Lack of Transportation (Non-Medical): No  Physical  Activity: Sufficiently Active (11/26/2023)   Exercise Vital Sign    Days of Exercise per Week: 3 days    Minutes of Exercise per Session: 60 min  Stress: Stress Concern Present (11/26/2023)   [employer] of Occupational Health - Occupational Stress Questionnaire    Feeling of Stress: To some extent  Social Connections: Socially Isolated (11/26/2023)   Social Connection and Isolation Panel    Frequency of Communication with Friends and Family: Once a week    Frequency of Social Gatherings with Friends and Family: More than three times a week    Attends Religious Services: Never    Database administrator or Organizations: No    Attends Engineer, structural: Not on file    Marital Status: Divorced     Family History: The patient's family history includes Dementia in her mother; Diabetes in her father, paternal aunt, paternal grandmother, and paternal uncle; Fainting in her child; Heart attack in her child; Heart disease in her brother, father, maternal uncle, paternal aunt, and paternal uncle; Stroke in her paternal grandmother.  ROS:   Please see the history of present illness.     All other systems reviewed and are negative.  EKGs/Labs/Other Studies Reviewed:    The following studies were reviewed today:       Recent Labs: 12/02/2023: ALT 20; BUN 22; Creat 0.68; Hemoglobin 13.0; Platelets 262; Potassium 4.2; Sodium 140  Recent Lipid Panel    Component Value Date/Time   CHOL 96 12/02/2023 0808   TRIG 81 12/02/2023 0808   HDL 40 (L) 12/02/2023 0808   CHOLHDL 2.4 12/02/2023 0808   VLDL 29 10/15/2022 0516   LDLCALC 40 12/02/2023 0808     Risk Assessment/Calculations:             Physical Exam:    VS:  BP 110/62 (BP Location: Left Arm, Patient Position: Sitting)   Pulse 75   Ht 4' 10 (1.473 m)   Wt 169 lb 6.4 oz (76.8 kg)   SpO2 98%   BMI 35.40 kg/m     Wt Readings from Last 3 Encounters:  12/09/23 169 lb 6.4 oz (76.8 kg)  12/02/23 169 lb 11.2 oz (77  kg)  11/18/23 169 lb 5 oz (76.8 kg)     GEN:  Well nourished, well developed in no acute distress HEENT: Normal NECK: No JVD; No carotid bruits CARDIAC: RRR, no murmurs, rubs, gallops RESPIRATORY:  Clear to auscultation without rales, wheezing or rhonchi  ABDOMEN: Soft, non-tender, non-distended MUSCULOSKELETAL:  No edema; No deformity  SKIN: Warm and dry NEUROLOGIC:  Alert and oriented x 3 PSYCHIATRIC:  Normal affect   ASSESSMENT:  1. Precordial pain   2. Primary hypertension   3. TIA (transient ischemic attack)    PLAN:    In order of problems listed above:  Epigastric pain, coronary CT 6/25 showed no CAD.  Echo 09/2022 EF 60 to 65%.   History of TIA, continue Plavix , Lipitor 80. Hypertension, BP controlled.  Continue lisinopril  20, amlodipine  10, Lopressor  25 twice daily. History of TIA, cardiac monitor showed no A-fib or flutter.  Intracranial stenosis/vascular likely reason for TIA/cerebrovascular incident.  On Plavix , Lipitor.  Follow-up as needed.      Medication Adjustments/Labs and Tests Ordered: Current medicines are reviewed at length with the patient today.  Concerns regarding medicines are outlined above.  No orders of the defined types were placed in this encounter.  No orders of the defined types were placed in this encounter.   There are no Patient Instructions on file for this visit.   Signed, Redell Cave, MD  12/09/2023 8:25 AM    [facility]

## 2023-12-09 NOTE — Patient Instructions (Signed)

## 2023-12-12 ENCOUNTER — Ambulatory Visit: Admitting: Nurse Practitioner

## 2023-12-26 ENCOUNTER — Ambulatory Visit

## 2023-12-26 VITALS — BP 110/62 | Ht <= 58 in | Wt 169.0 lb

## 2023-12-26 DIAGNOSIS — Z0000 Encounter for general adult medical examination without abnormal findings: Secondary | ICD-10-CM

## 2023-12-26 DIAGNOSIS — Z2821 Immunization not carried out because of patient refusal: Secondary | ICD-10-CM

## 2023-12-26 NOTE — Patient Instructions (Signed)
 Kathleen Hickman,  Thank you for taking the time for your Medicare Wellness Visit. I appreciate your continued commitment to your health goals. Please review the care plan we discussed, and feel free to reach out if I can assist you further.  Medicare recommends these wellness visits once per year to help you and your care team stay ahead of potential health issues. These visits are designed to focus on prevention, allowing your provider to concentrate on managing your acute and chronic conditions during your regular appointments.  Please note that Annual Wellness Visits do not include a physical exam. Some assessments may be limited, especially if the visit was conducted virtually. If needed, we may recommend a separate in-person follow-up with your provider.  Ongoing Care Seeing your primary care provider every 3 to 6 months helps us  monitor your health and provide consistent, personalized care.  Referrals If a referral was made during today's visit and you haven't received any updates within two weeks, please contact the referred provider directly to check on the status.  Recommended Screenings:  Health Maintenance  Topic Date Due   Zoster (Shingles) Vaccine (1 of 2) Never done   COVID-19 Vaccine (1 - 2024-25 season) Never done   Hepatitis C Screening  03/26/2024*   Eye exam for diabetics  12/31/2023   Hemoglobin A1C  06/03/2024   Mammogram  11/04/2024   Yearly kidney function blood test for diabetes  12/01/2024   Yearly kidney health urinalysis for diabetes  12/01/2024   Complete foot exam   12/01/2024   Cologuard (Stool DNA test)  12/03/2024   Medicare Annual Wellness Visit  12/25/2024   DTaP/Tdap/Td vaccine (2 - Td or Tdap) 08/03/2029   Pneumococcal Vaccine for age over 1  Completed   DEXA scan (bone density measurement)  Completed   HPV Vaccine  Aged Out   Meningitis B Vaccine  Aged Out   Flu Shot  Discontinued  *Topic was postponed. The date shown is not the original due date.        12/26/2023   10:24 AM  Advanced Directives  Does Patient Have a Medical Advance Directive? No  Would patient like information on creating a medical advance directive? No - Patient declined   Advance Care Planning is important because it: Ensures you receive medical care that aligns with your values, goals, and preferences. Provides guidance to your family and loved ones, reducing the emotional burden of decision-making during critical moments.  Vision: Annual vision screenings are recommended for early detection of glaucoma, cataracts, and diabetic retinopathy. These exams can also reveal signs of chronic conditions such as diabetes and high blood pressure.  Dental: Annual dental screenings help detect early signs of oral cancer, gum disease, and other conditions linked to overall health, including heart disease and diabetes.

## 2023-12-26 NOTE — Progress Notes (Signed)
 Because this visit was a virtual/telehealth visit,  certain criteria was not obtained, such a blood pressure, CBG if applicable, and timed get up and go. Any medications not marked as taking were not mentioned during the medication reconciliation part of the visit. Any vitals not documented were not able to be obtained due to this being a telehealth visit or patient was unable to self-report a recent blood pressure reading due to a lack of equipment at home via telehealth. Vitals that have been documented are verbally provided by the patient.  This visit was performed by a medical professional under my direct supervision. I was immediately available for consultation/collaboration. I have reviewed and agree with the Annual Wellness Visit documentation.  Subjective:   Kathleen Hickman is a 68 y.o. who presents for a Medicare Wellness preventive visit.  As a reminder, Annual Wellness Visits don't include a physical exam, and some assessments may be limited, especially if this visit is performed virtually. We may recommend an in-person follow-up visit with your provider if needed.  Visit Complete: Virtual I connected with  Kathleen Hickman on 12/26/23 by a audio enabled telemedicine application and verified that I am speaking with the correct person using two identifiers.  Patient Location: Home  Provider Location: Home Office  I discussed the limitations of evaluation and management by telemedicine. The patient expressed understanding and agreed to proceed.  Vital Signs: Because this visit was a virtual/telehealth visit, some criteria may be missing or patient reported. Any vitals not documented were not able to be obtained and vitals that have been documented are patient reported.  VideoDeclined- This patient declined Librarian, academic. Therefore the visit was completed with audio only.  Persons Participating in Visit: Patient.  AWV Questionnaire: No: Patient  Medicare AWV questionnaire was not completed prior to this visit.  Cardiac Risk Factors include: advanced age (>16men, >53 women);obesity (BMI >30kg/m2);diabetes mellitus;dyslipidemia;hypertension     Objective:    Today's Vitals   12/26/23 1024  BP: 110/62  Weight: 169 lb (76.7 kg)  Height: 4' 10 (1.473 m)   Body mass index is 35.32 kg/m.     12/26/2023   10:24 AM 10/14/2022    9:21 PM 10/14/2022   12:00 PM 10/14/2022   10:59 AM 01/07/2017    8:58 AM 03/16/2016    3:46 PM  Advanced Directives  Does Patient Have a Medical Advance Directive? No No No No No  No   Would patient like information on creating a medical advance directive? No - Patient declined Yes (Inpatient - patient requests chaplain consult to create a medical advance directive) No - Patient declined   No - Patient declined      Data saved with a previous flowsheet row definition    Current Medications (verified) Outpatient Encounter Medications as of 12/26/2023  Medication Sig   amLODipine  (NORVASC ) 10 MG tablet Take 1 tablet (10 mg total) by mouth daily.   atorvastatin  (LIPITOR) 80 MG tablet Take 1 tablet (80 mg total) by mouth daily.   clopidogrel  (PLAVIX ) 75 MG tablet Take 1 tablet (75 mg total) by mouth daily.   fluticasone  (FLONASE ) 50 MCG/ACT nasal spray SPRAY 2 SPRAYS INTO EACH NOSTRIL EVERY DAY   glucose blood (ONETOUCH VERIO) test strip FOR USE WHEN CHECKING BLOOD SUGARS AS DIRECTED   Lancets (ONETOUCH DELICA PLUS LANCET33G) MISC AS DIRECTED 90 DAYS   lisinopril  (ZESTRIL ) 20 MG tablet Take 1 tablet (20 mg total) by mouth daily.   metFORMIN  (GLUCOPHAGE -XR) 500  MG 24 hr tablet Take 1 tablet (500 mg total) by mouth 2 (two) times daily with a meal.   metoprolol  succinate (TOPROL -XL) 25 MG 24 hr tablet TAKE 1 TABLET (25 MG) BY MOUTH IN THE MORNING AND AT BEDTIME   Semaglutide ,0.25 or 0.5MG /DOS, (OZEMPIC , 0.25 OR 0.5 MG/DOSE,) 2 MG/3ML SOPN INJECT 0.5 MG INTO THE SKIN ONE TIME PER WEEK   No facility-administered  encounter medications on file as of 12/26/2023.    Allergies (verified) Morphine and codeine, Aspirin , Ciprofloxacin, Codeine, Elemental sulfur, Influenza vaccines, and Penicillin g   History: Past Medical History:  Diagnosis Date   Diabetes mellitus without complication (HCC)    Hyperlipidemia    Hypertension    Past Surgical History:  Procedure Laterality Date   CARPAL TUNNEL WITH CUBITAL TUNNEL Right    CHOLECYSTECTOMY     HAND SURGERY Right    fracture repair in 1990   SHOULDER SURGERY Right    SPINE SURGERY     TUBAL LIGATION     Family History  Problem Relation Age of Onset   Dementia Mother    Heart disease Father    Diabetes Father    Heart disease Brother    Heart disease Maternal Uncle    Diabetes Paternal Aunt    Heart disease Paternal Aunt    Diabetes Paternal Uncle    Heart disease Paternal Uncle    Diabetes Paternal Grandmother    Stroke Paternal Grandmother    Heart attack Child    Fainting Child    Social History   Socioeconomic History   Marital status: Single    Spouse name: Not on file   Number of children: 1   Years of education: Not on file   Highest education level: Some college, no degree  Occupational History   Not on file  Tobacco Use   Smoking status: Never    Passive exposure: Never   Smokeless tobacco: Never  Vaping Use   Vaping status: Never Used  Substance and Sexual Activity   Alcohol use: Not Currently    Alcohol/week: 2.0 standard drinks of alcohol    Types: 2 Cans of beer per week   Drug use: Never   Sexual activity: Yes  Other Topics Concern   Not on file  Social History Narrative   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 Lives alone.     Social Drivers of Health   Financial Resource Strain: Medium Risk (12/26/2023)   Overall Financial Resource Strain (CARDIA)    Difficulty of Paying Living Expenses: Somewhat hard  Food Insecurity: No Food Insecurity (12/26/2023)   Hunger Vital Sign    Worried About Running  Out of Food in the Last Year: Never true    Ran Out of Food in the Last Year: Never true  Recent Concern: Food Insecurity - Food Insecurity Present (11/26/2023)   Hunger Vital Sign    Worried About Running Out of Food in the Last Year: Sometimes true    Ran Out of Food in the Last Year: Sometimes true  Transportation Needs: No Transportation Needs (12/26/2023)   PRAPARE - Administrator, Civil Service (Medical): No    Lack of Transportation (Non-Medical): No  Physical Activity: Sufficiently Active (12/26/2023)   Exercise Vital Sign    Days of Exercise per Week: 3 days    Minutes of Exercise per Session: 60 min  Stress: Stress Concern Present (12/26/2023)   [employer] of Occupational Health - Occupational Stress Questionnaire  Feeling of Stress: To some extent  Social Connections: Socially Isolated (12/26/2023)   Social Connection and Isolation Panel    Frequency of Communication with Friends and Family: Three times a week    Frequency of Social Gatherings with Friends and Family: More than three times a week    Attends Religious Services: Never    Database administrator or Organizations: No    Attends Engineer, structural: Never    Marital Status: Divorced    Tobacco Counseling Counseling given: Not Answered    Clinical Intake:  Pre-visit preparation completed: Yes  Pain : No/denies pain     BMI - recorded: 35.32 Nutritional Status: BMI > 30  Obese Nutritional Risks: None Diabetes: No  Lab Results  Component Value Date   HGBA1C 6.5 (H) 12/02/2023   HGBA1C 7.0 (A) 08/12/2023   HGBA1C 8.6 (H) 05/02/2023     How often do you need to have someone help you when you read instructions, pamphlets, or other written materials from your doctor or pharmacy?: 1 - Never  Interpreter Needed?: No  Information entered by :: Beverely Suen,cma   Activities of Daily Living     12/26/2023   10:27 AM 03/27/2023    8:19 AM  In your present state of health,  do you have any difficulty performing the following activities:  Hearing? 0 0  Vision? 0 0  Difficulty concentrating or making decisions? 0 0  Walking or climbing stairs? 0 0  Dressing or bathing? 0 0  Doing errands, shopping? 0 0  Preparing Food and eating ? N   Using the Toilet? N   In the past six months, have you accidently leaked urine? N   Do you have problems with loss of bowel control? N   Managing your Medications? N   Managing your Finances? N   Housekeeping or managing your Housekeeping? N     Patient Care Team: Gareth Mliss FALCON, FNP as PCP - General (Nurse Practitioner)  I have updated your Care Teams any recent Medical Services you may have received from other providers in the past year.     Assessment:   This is a routine wellness examination for [place].  Hearing/Vision screen Hearing Screening - Comments:: No difficulties hearing  Vision Screening - Comments:: Patient wears glasses    Goals Addressed             This Visit's Progress    Patient Stated       To eat healthier and exercise        Depression Screen     12/26/2023   10:28 AM 08/12/2023    7:37 AM 05/20/2023    8:07 AM 04/18/2023    8:11 AM 03/27/2023    8:19 AM 03/13/2023    9:12 AM 02/27/2023    1:25 PM  PHQ 2/9 Scores  PHQ - 2 Score 2 0 2 0 1 0 0  PHQ- 9 Score 3 0 4 0 3  0    Fall Risk     12/26/2023   10:26 AM 08/12/2023    7:32 AM 05/20/2023    7:59 AM 03/27/2023    8:18 AM 03/13/2023    9:12 AM  Fall Risk   Falls in the past year? 1 0 1 1 0  Number falls in past yr: 0 0 1 1 0  Injury with Fall? 0 0 1 0 0  Risk for fall due to : History of fall(s);Impaired balance/gait;Orthopedic patient No Fall  Risks History of fall(s) History of fall(s) No Fall Risks  Follow up Falls evaluation completed;Education provided Falls prevention discussed;Education provided;Falls evaluation completed Falls evaluation completed;Education provided;Falls prevention discussed Falls prevention  discussed;Education provided;Falls evaluation completed Falls prevention discussed    MEDICARE RISK AT HOME:  Medicare Risk at Home Any stairs in or around the home?: No If so, are there any without handrails?: No Home free of loose throw rugs in walkways, pet beds, electrical cords, etc?: Yes Adequate lighting in your home to reduce risk of falls?: Yes Life alert?: No Use of a cane, walker or w/c?: No Grab bars in the bathroom?: Yes Shower chair or bench in shower?: Yes Elevated toilet seat or a handicapped toilet?: Yes  TIMED UP AND GO:  Was the test performed?  No  Cognitive Function: 6CIT completed        12/26/2023   10:29 AM  6CIT Screen  What Year? 0 points  What month? 0 points  What time? 0 points  Count back from 20 0 points  Months in reverse 0 points  Repeat phrase 0 points  Total Score 0 points    Immunizations Immunization History  Administered Date(s) Administered   PNEUMOCOCCAL CONJUGATE-20 05/23/2021   Pneumococcal Polysaccharide-23 04/03/2020   Tdap 08/04/2019    Screening Tests Health Maintenance  Topic Date Due   Zoster Vaccines- Shingrix (1 of 2) Never done   COVID-19 Vaccine (1 - 2024-25 season) Never done   Hepatitis C Screening  03/26/2024 (Originally 11/14/1973)   OPHTHALMOLOGY EXAM  12/31/2023   HEMOGLOBIN A1C  06/03/2024   MAMMOGRAM  11/04/2024   Diabetic kidney evaluation - eGFR measurement  12/01/2024   Diabetic kidney evaluation - Urine ACR  12/01/2024   FOOT EXAM  12/01/2024   Fecal DNA (Cologuard)  12/03/2024   Medicare Annual Wellness (AWV)  12/25/2024   DTaP/Tdap/Td (2 - Td or Tdap) 08/03/2029   Pneumococcal Vaccine: 50+ Years  Completed   DEXA SCAN  Completed   HPV VACCINES  Aged Out   Meningococcal B Vaccine  Aged Out   Influenza Vaccine  Discontinued    Health Maintenance  Health Maintenance Due  Topic Date Due   Zoster Vaccines- Shingrix (1 of 2) Never done   COVID-19 Vaccine (1 - 2024-25 season) Never done    Health Maintenance Items Addressed:patient declined vaccinations    Additional Screening:  Vision Screening: Recommended annual ophthalmology exams for early detection of glaucoma and other disorders of the eye. Would you like a referral to an eye doctor? No    Dental Screening: Recommended annual dental exams for proper oral hygiene  Community Resource Referral / Chronic Care Management: CRR required this visit?  No   CCM required this visit?  No   Plan:    I have personally reviewed and noted the following in the patient's chart:   Medical and social history Use of alcohol, tobacco or illicit drugs  Current medications and supplements including opioid prescriptions. Patient is not currently taking opioid prescriptions. Functional ability and status Nutritional status Physical activity Advanced directives List of other physicians Hospitalizations, surgeries, and ER visits in previous 12 months Vitals Screenings to include cognitive, depression, and falls Referrals and appointments  In addition, I have reviewed and discussed with patient certain preventive protocols, quality metrics, and best practice recommendations. A written personalized care plan for preventive services as well as general preventive health recommendations were provided to patient.   Lyle MARLA Right, CMA   12/26/2023   After  Visit Summary: (MyChart) Due to this being a telephonic visit, the after visit summary with patients personalized plan was offered to patient via MyChart   Notes: Nothing significant to report at this time.

## 2024-01-21 NOTE — Progress Notes (Signed)
 There were no vitals taken for this visit.   Subjective:    Patient ID: Kathleen Hickman, female    DOB: 1955/10/23, 67 y.o.   MRN: [id number]  HPI: Kathleen Hickman is a 68 y.o. female  No chief complaint on file.   Discussed the use of AI scribe software for clinical note transcription with the patient, who gave verbal consent to proceed.           12/26/2023   10:28 AM 08/12/2023    7:37 AM 05/20/2023    8:07 AM  Depression screen PHQ 2/9  Decreased Interest 1 0 1  Down, Depressed, Hopeless 1 0 1  PHQ - 2 Score 2 0 2  Altered sleeping 0 0 1  Tired, decreased energy 0 0 1  Change in appetite 1 0 0  Feeling bad or failure about yourself  0 0 0  Trouble concentrating 0 0 0  Moving slowly or fidgety/restless 0 0 0  Suicidal thoughts 0 0 0  PHQ-9 Score 3 0 4  Difficult doing work/chores Not difficult at all Not difficult at all Somewhat difficult    Relevant past medical, surgical, family and social history reviewed and updated as indicated. Interim medical history since our last visit reviewed. Allergies and medications reviewed and updated.  Review of Systems  Per HPI unless specifically indicated above     Objective:     There were no vitals taken for this visit.  {Vitals History (Optional):23777} Wt Readings from Last 3 Encounters:  12/26/23 169 lb (76.7 kg)  12/09/23 169 lb 6.4 oz (76.8 kg)  12/02/23 169 lb 11.2 oz (77 kg)    Physical Exam   Results for orders placed or performed in visit on 12/02/23  CBC with Differential/Platelet   Collection Time: 12/02/23  8:08 AM  Result Value Ref Range   WBC 6.9 3.8 - 10.8 Thousand/uL   RBC 4.17 3.80 - 5.10 Million/uL   Hemoglobin 13.0 11.7 - 15.5 g/dL   HCT 60.1 64.9 - 54.9 %   MCV 95.4 80.0 - 100.0 fL   MCH 31.2 27.0 - 33.0 pg   MCHC 32.7 32.0 - 36.0 g/dL   RDW 88.2 88.9 - 84.9 %   Platelets 262 140 - 400 Thousand/uL   MPV 10.4 7.5 - 12.5 fL   Neutro Abs 3,843 1,500 - 7,800 cells/uL   Absolute  Lymphocytes 2,270 850 - 3,900 cells/uL   Absolute Monocytes 580 200 - 950 cells/uL   Eosinophils Absolute 179 15 - 500 cells/uL   Basophils Absolute 28 0 - 200 cells/uL   Neutrophils Relative % 55.7 %   Total Lymphocyte 32.9 %   Monocytes Relative 8.4 %   Eosinophils Relative 2.6 %   Basophils Relative 0.4 %  Comprehensive metabolic panel with GFR   Collection Time: 12/02/23  8:08 AM  Result Value Ref Range   Glucose, Bld 118 (H) 65 - 99 mg/dL   BUN 22 7 - 25 mg/dL   Creat 9.31 9.49 - 8.94 mg/dL   eGFR 94 > OR = 60 fO/fpw/8.26f7   BUN/Creatinine Ratio SEE NOTE: 6 - 22 (calc)   Sodium 140 135 - 146 mmol/L   Potassium 4.2 3.5 - 5.3 mmol/L   Chloride 107 98 - 110 mmol/L   CO2 25 20 - 32 mmol/L   Calcium  9.4 8.6 - 10.4 mg/dL   Total Protein 6.8 6.1 - 8.1 g/dL   Albumin 4.7 3.6 - 5.1 g/dL   Globulin 2.1  1.9 - 3.7 g/dL (calc)   AG Ratio 2.2 1.0 - 2.5 (calc)   Total Bilirubin 0.5 0.2 - 1.2 mg/dL   Alkaline phosphatase (APISO) 46 37 - 153 U/L   AST 22 10 - 35 U/L   ALT 20 6 - 29 U/L  Lipid panel   Collection Time: 12/02/23  8:08 AM  Result Value Ref Range   Cholesterol 96 <200 mg/dL   HDL 40 (L) > OR = 50 mg/dL   Triglycerides 81 <849 mg/dL   LDL Cholesterol (Calc) 40 mg/dL (calc)   Total CHOL/HDL Ratio 2.4 <5.0 (calc)   Non-HDL Cholesterol (Calc) 56 <869 mg/dL (calc)  Microalbumin / creatinine urine ratio   Collection Time: 12/02/23  8:08 AM  Result Value Ref Range   Creatinine, Urine 68 20 - 275 mg/dL   Microalb, Ur 0.7 mg/dL   Microalb Creat Ratio 10 <30 mg/g creat  Hemoglobin A1c   Collection Time: 12/02/23  8:08 AM  Result Value Ref Range   Hgb A1c MFr Bld 6.5 (H) <5.7 %   Mean Plasma Glucose 140 mg/dL   eAG (mmol/L) 7.7 mmol/L  Extra   Collection Time: 12/02/23  8:08 AM  Result Value Ref Range   EXTRA TUBE RECEIVED     SPECIMEN TYPE RECEIVED: SS    {Labs (Optional):23779}       Assessment & Plan:   Problem List Items Addressed This Visit   None     Assessment and Plan         Follow up plan: No follow-ups on file.

## 2024-01-22 ENCOUNTER — Ambulatory Visit (INDEPENDENT_AMBULATORY_CARE_PROVIDER_SITE_OTHER): Admitting: Nurse Practitioner

## 2024-01-22 ENCOUNTER — Encounter: Payer: Self-pay | Admitting: Nurse Practitioner

## 2024-01-22 VITALS — BP 120/62 | HR 94 | Temp 98.2°F | Resp 18 | Ht <= 58 in | Wt 169.0 lb

## 2024-01-22 DIAGNOSIS — Z8673 Personal history of transient ischemic attack (TIA), and cerebral infarction without residual deficits: Secondary | ICD-10-CM | POA: Diagnosis not present

## 2024-01-22 DIAGNOSIS — R42 Dizziness and giddiness: Secondary | ICD-10-CM | POA: Diagnosis not present

## 2024-01-29 DIAGNOSIS — H401111 Primary open-angle glaucoma, right eye, mild stage: Secondary | ICD-10-CM | POA: Diagnosis not present

## 2024-01-29 DIAGNOSIS — H401122 Primary open-angle glaucoma, left eye, moderate stage: Secondary | ICD-10-CM | POA: Diagnosis not present

## 2024-02-03 DIAGNOSIS — H2513 Age-related nuclear cataract, bilateral: Secondary | ICD-10-CM | POA: Diagnosis not present

## 2024-02-03 DIAGNOSIS — H401122 Primary open-angle glaucoma, left eye, moderate stage: Secondary | ICD-10-CM | POA: Diagnosis not present

## 2024-02-03 DIAGNOSIS — H401111 Primary open-angle glaucoma, right eye, mild stage: Secondary | ICD-10-CM | POA: Diagnosis not present

## 2024-02-03 DIAGNOSIS — E113293 Type 2 diabetes mellitus with mild nonproliferative diabetic retinopathy without macular edema, bilateral: Secondary | ICD-10-CM | POA: Diagnosis not present

## 2024-02-03 LAB — OPHTHALMOLOGY REPORT-SCANNED

## 2024-02-05 ENCOUNTER — Encounter: Payer: Self-pay | Admitting: Nurse Practitioner

## 2024-02-09 ENCOUNTER — Encounter: Payer: Self-pay | Admitting: Nurse Practitioner

## 2024-02-12 ENCOUNTER — Other Ambulatory Visit: Payer: Self-pay | Admitting: Nurse Practitioner

## 2024-02-12 DIAGNOSIS — E1165 Type 2 diabetes mellitus with hyperglycemia: Secondary | ICD-10-CM

## 2024-02-13 NOTE — Telephone Encounter (Signed)
 Requested Prescriptions  Pending Prescriptions Disp Refills   Semaglutide ,0.25 or 0.5MG /DOS, (OZEMPIC , 0.25 OR 0.5 MG/DOSE,) 2 MG/3ML SOPN [Pharmacy Med Name: OZEMPIC  0.25-0.5 MG/DOSE PEN] 2 mL 2    Sig: INJECT 0.5 MG INTO THE SKIN ONE TIME PER WEEK     Endocrinology:  Diabetes - GLP-1 Receptor Agonists - semaglutide  Failed - 02/13/2024  1:30 PM      Failed - HBA1C in normal range and within 180 days    Hgb A1c MFr Bld  Date Value Ref Range Status  12/02/2023 6.5 (H) <5.7 % Final    Comment:    For someone without known diabetes, a hemoglobin A1c value of 6.5% or greater indicates that they may have  diabetes and this should be confirmed with a follow-up  test. . For someone with known diabetes, a value <7% indicates  that their diabetes is well controlled and a value  greater than or equal to 7% indicates suboptimal  control. A1c targets should be individualized based on  duration of diabetes, age, comorbid conditions, and  other considerations. . Currently, no consensus exists regarding use of hemoglobin A1c for diagnosis of diabetes for children. .          Passed - Cr in normal range and within 360 days    Creat  Date Value Ref Range Status  12/02/2023 0.68 0.50 - 1.05 mg/dL Final   Creatinine, Urine  Date Value Ref Range Status  12/02/2023 68 20 - 275 mg/dL Final         Passed - Valid encounter within last 6 months    Recent Outpatient Visits           3 weeks ago Dizziness   [facility] Gareth Mliss FALCON, FNP   2 months ago Essential hypertension   [facility] Gareth Mliss FALCON, FNP   6 months ago Essential hypertension   [facility] Gareth Mliss FALCON, FNP   8 months ago Mixed hyperlipidemia   [facility] Gareth Mliss FALCON, [place]

## 2024-02-27 ENCOUNTER — Other Ambulatory Visit: Payer: Self-pay | Admitting: Nurse Practitioner

## 2024-02-27 DIAGNOSIS — I10 Essential (primary) hypertension: Secondary | ICD-10-CM

## 2024-02-28 NOTE — Telephone Encounter (Signed)
 Requested Prescriptions  Pending Prescriptions Disp Refills   metoprolol  succinate (TOPROL -XL) 25 MG 24 hr tablet [Pharmacy Med Name: METOPROLOL  SUCC ER 25 MG TAB] 180 tablet 1    Sig: TAKE 1 TABLET (25 MG) BY MOUTH IN THE MORNING AND AT BEDTIME     Cardiovascular:  Beta Blockers Passed - 02/28/2024  7:04 AM      Passed - Last BP in normal range    BP Readings from Last 1 Encounters:  01/22/24 120/62         Passed - Last Heart Rate in normal range    Pulse Readings from Last 1 Encounters:  01/22/24 94         Passed - Valid encounter within last 6 months    Recent Outpatient Visits           1 month ago Dizziness   [facility] Gareth Mliss FALCON, FNP   2 months ago Essential hypertension   [facility] Gareth Mliss FALCON, FNP   6 months ago Essential hypertension   [facility] Gareth Mliss FALCON, FNP   8 months ago Mixed hyperlipidemia   [facility] Gareth Mliss FALCON, [place]

## 2024-04-06 ENCOUNTER — Ambulatory Visit: Admitting: Nurse Practitioner

## 2024-05-06 ENCOUNTER — Other Ambulatory Visit: Payer: Self-pay | Admitting: Nurse Practitioner

## 2024-05-06 DIAGNOSIS — E1165 Type 2 diabetes mellitus with hyperglycemia: Secondary | ICD-10-CM

## 2024-05-07 NOTE — Telephone Encounter (Signed)
 Requested Prescriptions  Pending Prescriptions Disp Refills   OZEMPIC , 0.25 OR 0.5 MG/DOSE, 2 MG/3ML SOPN [Pharmacy Med Name: OZEMPIC  0.25-0.5 MG/DOSE PEN] 2 mL 2    Sig: INJECT 0.5 MG INTO THE SKIN ONE TIME PER WEEK     Endocrinology:  Diabetes - GLP-1 Receptor Agonists - semaglutide  Failed - 05/07/2024  7:54 AM      Failed - HBA1C in normal range and within 180 days    Hgb A1c MFr Bld  Date Value Ref Range Status  12/02/2023 6.5 (H) <5.7 % Final    Comment:    For someone without known diabetes, a hemoglobin A1c value of 6.5% or greater indicates that they may have  diabetes and this should be confirmed with a follow-up  test. . For someone with known diabetes, a value <7% indicates  that their diabetes is well controlled and a value  greater than or equal to 7% indicates suboptimal  control. A1c targets should be individualized based on  duration of diabetes, age, comorbid conditions, and  other considerations. . Currently, no consensus exists regarding use of hemoglobin A1c for diagnosis of diabetes for children. .          Passed - Cr in normal range and within 360 days    Creat  Date Value Ref Range Status  12/02/2023 0.68 0.50 - 1.05 mg/dL Final   Creatinine, Urine  Date Value Ref Range Status  12/02/2023 68 20 - 275 mg/dL Final         Passed - Valid encounter within last 6 months    Recent Outpatient Visits           3 months ago Dizziness   [facility] Gareth Mliss FALCON, FNP   5 months ago Essential hypertension   [facility] Gareth Mliss FALCON, FNP   8 months ago Essential hypertension   [facility] Gareth Mliss FALCON, FNP   11 months ago Mixed hyperlipidemia   [facility] Gareth Mliss FALCON, [place]

## 2024-05-16 ENCOUNTER — Other Ambulatory Visit: Payer: Self-pay | Admitting: Nurse Practitioner

## 2024-05-16 DIAGNOSIS — E1165 Type 2 diabetes mellitus with hyperglycemia: Secondary | ICD-10-CM

## 2024-05-17 NOTE — Telephone Encounter (Signed)
 Requested by interface surescripts. Future visit 06/03/24.  Requested Prescriptions  Pending Prescriptions Disp Refills   metFORMIN  (GLUCOPHAGE -XR) 500 MG 24 hr tablet [Pharmacy Med Name: METFORMIN  HCL ER 500 MG TABLET] 180 tablet 0    Sig: TAKE 1 TABLET BY MOUTH 2 TIMES DAILY WITH A MEAL.     Endocrinology:  Diabetes - Biguanides Failed - 05/17/2024  1:50 PM      Failed - B12 Level in normal range and within 720 days    No results found for: VITAMINB12       Passed - Cr in normal range and within 360 days    Creat  Date Value Ref Range Status  12/02/2023 0.68 0.50 - 1.05 mg/dL Final   Creatinine, Urine  Date Value Ref Range Status  12/02/2023 68 20 - 275 mg/dL Final         Passed - HBA1C is between 0 and 7.9 and within 180 days    Hgb A1c MFr Bld  Date Value Ref Range Status  12/02/2023 6.5 (H) <5.7 % Final    Comment:    For someone without known diabetes, a hemoglobin A1c value of 6.5% or greater indicates that they may have  diabetes and this should be confirmed with a follow-up  test. . For someone with known diabetes, a value <7% indicates  that their diabetes is well controlled and a value  greater than or equal to 7% indicates suboptimal  control. A1c targets should be individualized based on  duration of diabetes, age, comorbid conditions, and  other considerations. . Currently, no consensus exists regarding use of hemoglobin A1c for diagnosis of diabetes for children. .          Passed - eGFR in normal range and within 360 days    GFR calc Af Amer  Date Value Ref Range Status  05/05/2009  >60 mL/min Final   >60        The eGFR has been calculated using the MDRD equation. This calculation has not been validated in all clinical situations. eGFR's persistently <60 mL/min signify possible Chronic Kidney Disease.   GFR, Estimated  Date Value Ref Range Status  10/14/2022 >60 >60 mL/min Final    Comment:    (NOTE) Calculated using the CKD-EPI  Creatinine Equation (2021)    eGFR  Date Value Ref Range Status  12/02/2023 94 > OR = 60 mL/min/1.45m2 Final  09/08/2023 95 >59 mL/min/1.73 Final         Passed - Valid encounter within last 6 months    Recent Outpatient Visits           3 months ago Dizziness   [facility] Gareth Mliss FALCON, FNP   5 months ago Essential hypertension   [facility] Gareth Mliss FALCON, FNP   9 months ago Essential hypertension   [facility] Gareth Mliss FALCON, FNP   11 months ago Mixed hyperlipidemia   [facility] Gareth Mliss FALCON, FNP              Passed - CBC within normal limits and completed in the last 12 months    WBC  Date Value Ref Range Status  12/02/2023 6.9 3.8 - 10.8 Thousand/uL Final   RBC  Date Value Ref Range Status  12/02/2023 4.17 3.80 - 5.10 Million/uL Final   Hemoglobin  Date Value Ref Range Status  12/02/2023 13.0 11.7 - 15.5 g/dL Final   HCT  Date Value Ref Range Status  12/02/2023 39.8 35.0 - 45.0 % Final   MCHC  Date Value Ref Range Status  12/02/2023 32.7 32.0 - 36.0 g/dL Final    Comment:    For adults, a slight decrease in the calculated MCHC value (in the range of 30 to 32 g/dL) is most likely not clinically significant; however, it should be interpreted with caution in correlation with other red cell parameters and the patient's clinical condition.    [facility]  Date Value Ref Range Status  12/02/2023 31.2 27.0 - 33.0 pg Final   MCV  Date Value Ref Range Status  12/02/2023 95.4 80.0 - 100.0 fL Final   No results found for: PLTCOUNTKUC, LABPLAT, POCPLA RDW  Date Value Ref Range Status  12/02/2023 11.7 11.0 - 15.0 % Final

## 2024-06-01 ENCOUNTER — Ambulatory Visit: Admitting: Neurology

## 2024-06-03 ENCOUNTER — Ambulatory Visit: Admitting: Nurse Practitioner

## 2024-11-16 ENCOUNTER — Ambulatory Visit (INDEPENDENT_AMBULATORY_CARE_PROVIDER_SITE_OTHER): Admitting: Vascular Surgery

## 2024-11-16 ENCOUNTER — Encounter (INDEPENDENT_AMBULATORY_CARE_PROVIDER_SITE_OTHER)

## 2024-12-31 ENCOUNTER — Ambulatory Visit
